# Patient Record
Sex: Male | Born: 1995 | Race: White | Hispanic: No | Marital: Single | Smoking: Never smoker
Health system: Southern US, Community
[De-identification: ages and names within clinical notes are randomized; demographics above are authoritative.]

## PROBLEM LIST (undated history)

## (undated) ENCOUNTER — Ambulatory Visit: Admission: EM | Payer: PRIVATE HEALTH INSURANCE

## (undated) DIAGNOSIS — F419 Anxiety disorder, unspecified: Secondary | ICD-10-CM

## (undated) DIAGNOSIS — M1990 Unspecified osteoarthritis, unspecified site: Secondary | ICD-10-CM

## (undated) DIAGNOSIS — J45909 Unspecified asthma, uncomplicated: Secondary | ICD-10-CM

## (undated) DIAGNOSIS — F32A Depression, unspecified: Secondary | ICD-10-CM

## (undated) HISTORY — DX: Unspecified asthma, uncomplicated: J45.909

## (undated) HISTORY — DX: Anxiety disorder, unspecified: F41.9

## (undated) HISTORY — DX: Depression, unspecified: F32.A

---

## 2016-01-07 ENCOUNTER — Encounter: Payer: Self-pay | Admitting: *Deleted

## 2016-01-07 ENCOUNTER — Emergency Department

## 2016-01-07 ENCOUNTER — Emergency Department
Admission: EM | Admit: 2016-01-07 | Discharge: 2016-01-07 | Disposition: A | Discharge status: Home / Self Care | Attending: Emergency Medicine | Admitting: Emergency Medicine

## 2016-01-07 DIAGNOSIS — S0990XA Unspecified injury of head, initial encounter: Secondary | ICD-10-CM | POA: Diagnosis present

## 2016-01-07 DIAGNOSIS — S0083XA Contusion of other part of head, initial encounter: Secondary | ICD-10-CM | POA: Diagnosis not present

## 2016-01-07 DIAGNOSIS — S0081XA Abrasion of other part of head, initial encounter: Secondary | ICD-10-CM | POA: Diagnosis not present

## 2016-01-07 DIAGNOSIS — F1310 Sedative, hypnotic or anxiolytic abuse, uncomplicated: Secondary | ICD-10-CM | POA: Insufficient documentation

## 2016-01-07 DIAGNOSIS — Y9389 Activity, other specified: Secondary | ICD-10-CM | POA: Diagnosis not present

## 2016-01-07 DIAGNOSIS — F10129 Alcohol abuse with intoxication, unspecified: Secondary | ICD-10-CM | POA: Diagnosis not present

## 2016-01-07 DIAGNOSIS — Y998 Other external cause status: Secondary | ICD-10-CM | POA: Insufficient documentation

## 2016-01-07 DIAGNOSIS — F17200 Nicotine dependence, unspecified, uncomplicated: Secondary | ICD-10-CM | POA: Diagnosis not present

## 2016-01-07 DIAGNOSIS — W01198A Fall on same level from slipping, tripping and stumbling with subsequent striking against other object, initial encounter: Secondary | ICD-10-CM | POA: Insufficient documentation

## 2016-01-07 DIAGNOSIS — Y9289 Other specified places as the place of occurrence of the external cause: Secondary | ICD-10-CM | POA: Insufficient documentation

## 2016-01-07 LAB — URINE DRUG SCREEN, QUALITATIVE (ARMC ONLY)
AMPHETAMINES, UR SCREEN: NOT DETECTED
BARBITURATES, UR SCREEN: NOT DETECTED
Benzodiazepine, Ur Scrn: POSITIVE — AB
COCAINE METABOLITE, UR ~~LOC~~: NOT DETECTED
Cannabinoid 50 Ng, Ur ~~LOC~~: NOT DETECTED
MDMA (Ecstasy)Ur Screen: NOT DETECTED
METHADONE SCREEN, URINE: NOT DETECTED
Opiate, Ur Screen: NOT DETECTED
Phencyclidine (PCP) Ur S: NOT DETECTED
TRICYCLIC, UR SCREEN: NOT DETECTED

## 2016-01-07 LAB — ETHANOL: Alcohol, Ethyl (B): 125 mg/dL — ABNORMAL HIGH (ref <5)
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
## 2016-01-07 NOTE — ED Notes (Signed)
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
## 2016-01-07 NOTE — Discharge Instructions (Signed)
Head Injury, Adult °You have a head injury. Headaches and throwing up (vomiting) are common after a head injury. It should be easy to wake up from sleeping. Sometimes you must stay in the hospital. Most problems happen within the first 24 hours. Side effects may occur up to 7-10 days after the injury.  °WHAT ARE THE TYPES OF HEAD INJURIES? °Head injuries can be as minor as a bump. Some head injuries can be more severe. More severe head injuries include: °· A jarring injury to the brain (concussion). °· A bruise of the brain (contusion). This mean there is bleeding in the brain that can cause swelling. °· A cracked skull (skull fracture). °· Bleeding in the brain that collects, clots, and forms a bump (hematoma). °WHEN SHOULD I GET HELP RIGHT AWAY?  °· You are confused or sleepy. °· You cannot be woken up. °· You feel sick to your stomach (nauseous) or keep throwing up (vomiting). °· Your dizziness or unsteadiness is getting worse. °· You have very bad, lasting headaches that are not helped by medicine. Take medicines only as told by your doctor. °· You cannot use your arms or legs like normal. °· You cannot walk. °· You notice changes in the black spots in the center of the colored part of your eye (pupil). °· You have clear or bloody fluid coming from your nose or ears. °· You have trouble seeing. °During the next 24 hours after the injury, you must stay with someone who can watch you. This person should get help right away (call 911 in the [place] if you start to shake and are not able to control it (have seizures), you pass out, or you are unable to wake up. °HOW CAN I PREVENT A HEAD INJURY IN THE FUTURE? °· Wear seat belts. °· Wear a helmet while bike riding and playing sports like football. °· Stay away from dangerous activities around the house. °WHEN CAN I RETURN TO NORMAL ACTIVITIES AND ATHLETICS? °See your doctor before doing these activities. You should not do normal activities or play contact sports until 1  week after the following symptoms have stopped: °· Headache that does not go away. °· Dizziness. °· Poor attention. °· Confusion. °· Memory problems. °· Sickness to your stomach or throwing up. °· Tiredness. °· Fussiness. °· Bothered by bright lights or loud noises. °· Anxiousness or depression. °· Restless sleep. °MAKE SURE YOU:  °· Understand these instructions. °· Will watch your condition. °· Will get help right away if you are not doing well or get worse. °  °This information is not intended to replace advice given to you by your health care provider. Make sure you discuss any questions you have with your health care provider. °  °Document Released: 09/16/2008 Document Revised: 10/25/2014 Document Reviewed: 06/11/2013 °Elsevier Interactive Patient Education ©2016 [employer] ° °

## 2016-06-22 ENCOUNTER — Encounter: Payer: Self-pay | Admitting: Emergency Medicine

## 2016-06-22 ENCOUNTER — Ambulatory Visit
Admission: EM | Admit: 2016-06-22 | Discharge: 2016-06-22 | Disposition: A | Discharge status: Home / Self Care | Attending: Family Medicine | Admitting: Family Medicine

## 2016-06-22 DIAGNOSIS — S81851A Open bite, right lower leg, initial encounter: Secondary | ICD-10-CM

## 2016-06-22 DIAGNOSIS — L03115 Cellulitis of right lower limb: Secondary | ICD-10-CM

## 2016-06-22 DIAGNOSIS — Z23 Encounter for immunization: Secondary | ICD-10-CM | POA: Diagnosis not present

## 2016-06-22 DIAGNOSIS — W540XXA Bitten by dog, initial encounter: Secondary | ICD-10-CM

## 2016-06-22 MED ORDER — CEFTRIAXONE SODIUM 1 G IJ SOLR
1.0000 g | Freq: Once | INTRAMUSCULAR | Status: AC
  Administered 2016-06-22: 1 g via INTRAMUSCULAR

## 2016-06-22 MED ORDER — AMOXICILLIN-POT CLAVULANATE 875-125 MG PO TABS: 1.0000 | ORAL_TABLET | Freq: Two times a day (BID) | ORAL | 0 refills | Status: DC

## 2016-06-22 MED ORDER — HYDROCODONE-ACETAMINOPHEN 5-325 MG PO TABS: ORAL_TABLET | ORAL | 0 refills | Status: DC

## 2016-06-22 MED ORDER — TETANUS-DIPHTH-ACELL PERTUSSIS 5-2.5-18.5 LF-MCG/0.5 IM SUSP
0.5000 mL | Freq: Once | INTRAMUSCULAR | Status: AC
  Administered 2016-06-22: 0.5 mL via INTRAMUSCULAR

## 2016-06-22 NOTE — ED Notes (Signed)
Filed report with Officer Riley KillSwartz with [facility].

## 2016-06-22 NOTE — ED Notes (Signed)
[facility] was notified of dog bite and was told they will have an on-call [facility] officer contact us back.

## 2016-06-22 NOTE — ED Triage Notes (Signed)
Patient states that he was breaking up a dog fight and got bit in his right lower leg yesterday.  Patient states that one of the dog is his and the other is his sister's.  Patient has one puncture wound to his right lower leg.  Patient has no bleeding at this time.

## 2016-06-22 NOTE — ED Notes (Signed)
Patient shows no signs of adverse reaction to medication at this time.  

## 2016-08-03 NOTE — ED Provider Notes (Addendum)
[facility]    CSN: [id number] Arrival date & time: 06/22/16  1831     History   Chief Complaint Chief Complaint  Patient presents with  . Puncture Wound  . Animal Bite    HPI Denis Carreon is a 20 y.o. male.   20 yo male with a c/o right leg pain and redness since dog bite last night. States that he was breaking up a dog fight and got bit in his right lower leg yesterday.  Patient states that one of the dog is his and the other is his sister's.  Patient has one puncture wound to his right lower leg.  Patient has no bleeding at this time. Dogs are up to date on vaccines including rabies.         The history is provided by the patient.  Animal Bite    History reviewed. No pertinent past medical history.  There are no active problems to display for this patient.   History reviewed. No pertinent surgical history.     Home Medications    Prior to Admission medications   Medication Sig Start Date End Date Taking? Authorizing Provider  amoxicillin-clavulanate (AUGMENTIN) 875-125 MG tablet Take 1 tablet by mouth 2 (two) times daily. 06/22/16   Payton Mccallum, MD  HYDROcodone-acetaminophen (NORCO/VICODIN) 5-325 MG tablet 1-2 tabs po q 8 hours prn 06/22/16   Payton Mccallum, MD    Family History History reviewed. No pertinent family history.  Social History Social History  Substance Use Topics  . Smoking status: Former Games developer  . Smokeless tobacco: Never Used  . Alcohol use Yes     Comment: 3 times a week     Allergies   Review of patient's allergies indicates no known allergies.   Review of Systems Review of Systems   Physical Exam Triage Vital Signs ED Triage Vitals  Enc Vitals Group     BP 06/22/16 1848 133/79     Pulse Rate 06/22/16 1848 75     Resp 06/22/16 1848 16     Temp 06/22/16 1848 99.6 F (37.6 C)     Temp Source 06/22/16 1848 Tympanic     SpO2 06/22/16 1848 100 %     Weight 06/22/16 1850 250 lb (113.4 kg)     Height 06/22/16 1850  6\' 2"  (1.88 m)     Head Circumference --      Peak Flow --      Pain Score 06/22/16 1851 9     Pain Loc --      Pain Edu? --      Excl. in GC? --    No data found.   Updated Vital Signs BP 133/79 (BP Location: Right Arm)   Pulse 75   Temp 99.6 F (37.6 C) (Tympanic)   Resp 16   Ht 6\' 2"  (1.88 m)   Wt 250 lb (113.4 kg)   SpO2 100%   BMI 32.10 kg/m   Visual Acuity Right Eye Distance:   Left Eye Distance:   Bilateral Distance:    Right Eye Near:   Left Eye Near:    Bilateral Near:     Physical Exam  Constitutional: He appears well-developed and well-nourished. No distress.  Cardiovascular: Intact distal pulses.   Musculoskeletal:  Right lower extremity neurovascularly intact; puncture wound to right lower leg, shin area with surrounding blanchable erythema, edema, warmth and tenderness to palpation  Skin: He is not diaphoretic.  Nursing note and vitals reviewed.    UC  Treatments / Results  Labs (all labs ordered are listed, but only abnormal results are displayed) Labs Reviewed - No data to display  EKG  EKG Interpretation None       Radiology No results found.  Procedures Procedures (including critical care time)  Medications Ordered in UC Medications  Tdap (BOOSTRIX) injection 0.5 mL (0.5 mLs Intramuscular Given 06/22/16 1855)  cefTRIAXone (ROCEPHIN) injection 1 g (1 g Intramuscular Given 06/22/16 1951)     Initial Impression / Assessment and Plan / UC Course  I have reviewed the triage vital signs and the nursing notes.  Pertinent labs & imaging results that were available during my care of the patient were reviewed by me and considered in my medical decision making (see chart for details).  Clinical Course      Final Clinical Impressions(s) / UC Diagnoses   Final diagnoses:  Dog bite of right lower leg, initial encounter  Cellulitis of right lower extremity    New Prescriptions Discharge Medication List as of 06/22/2016  8:06 PM      START taking these medications   Details  amoxicillin-clavulanate (AUGMENTIN) 875-125 MG tablet Take 1 tablet by mouth 2 (two) times daily., Starting Tue 06/22/2016, Normal    HYDROcodone-acetaminophen (NORCO/VICODIN) 5-325 MG tablet 1-2 tabs po q 8 hours prn, Print       1. diagnosis reviewed with patient 2. rx as per orders above; reviewed possible side effects, interactions, risks and benefits  3. Patient given rocephin 1gm im x 1 4. Recommend supportive treatment with warm compresses to area 5.Follow-up prn if symptoms worsen or don't improve   Payton Mccallumrlando Hideko Esselman, MD 08/03/16 1750    Payton Mccallumrlando Nahuel Wilbert, MD 08/03/16 1752

## 2017-01-23 ENCOUNTER — Ambulatory Visit
Admission: EM | Admit: 2017-01-23 | Discharge: 2017-01-23 | Disposition: A | Discharge status: Home / Self Care | Payer: Managed Care, Other (non HMO) | Attending: Emergency Medicine | Admitting: Emergency Medicine

## 2017-01-23 ENCOUNTER — Encounter: Payer: Self-pay | Admitting: Gynecology

## 2017-01-23 ENCOUNTER — Ambulatory Visit (INDEPENDENT_AMBULATORY_CARE_PROVIDER_SITE_OTHER): Payer: Managed Care, Other (non HMO)

## 2017-01-23 DIAGNOSIS — M5481 Occipital neuralgia: Secondary | ICD-10-CM | POA: Diagnosis not present

## 2017-01-23 MED ORDER — KETOROLAC TROMETHAMINE 60 MG/2ML IM SOLN
60.0000 mg | Freq: Once | INTRAMUSCULAR | Status: AC
  Administered 2017-01-23: 60 mg via INTRAMUSCULAR

## 2017-01-23 MED ORDER — GABAPENTIN 300 MG PO CAPS: ORAL_CAPSULE | ORAL | 0 refills | Status: DC

## 2017-01-23 MED ORDER — TRAZODONE HCL 50 MG PO TABS: 50.0000 mg | ORAL_TABLET | Freq: Every day | ORAL | 0 refills | Status: DC

## 2017-01-23 NOTE — ED Provider Notes (Signed)
CSN: [id number]     Arrival date & time 01/23/17  1308 History   First MD Initiated Contact with Patient 01/23/17 [phone]     Chief Complaint  Patient presents with  . Neck Injury   (Consider location/radiation/quality/duration/timing/severity/associated sxs/prior Treatment) HPI  This a very pleasant 21 year old male who presents with left-sided neck pain that radiates into his occipital region x 2 months. He has had no injury that he knows of. He does lift heavy weights for recreation. He states that he gets very minimal amount of sleep at night due to the constant pain that he has when he lies down. The pain is most intense in a small area on the left occipital  posterior to his ear. He states in the morning  when he awakes his left eye feels "strained"   in comparison to his right. He has been seen by a physician in the past and has been given tramadol which has not been helpful. He has been using over-the-counter medications of Aleve and ibuprofen but also has not been helping. Pain is worse with rotation ,extension and flexion of his neck. He denies any upper extremity radicular symptoms. Has not lost any of the muscle strength of his upper extremities.       History reviewed. No pertinent past medical history. History reviewed. No pertinent surgical history. No family history on file. Social History  Substance Use Topics  . Smoking status: Former Games developer  . Smokeless tobacco: Never Used  . Alcohol use Yes     Comment: 3 times a week    Review of Systems  Constitutional: Positive for activity change. Negative for chills, diaphoresis and fatigue.  Musculoskeletal: Positive for neck pain and neck stiffness.  All other systems reviewed and are negative.   Allergies  Vancomycin  Home Medications   Prior to Admission medications   Medication Sig Start Date End Date Taking? Authorizing Provider  amoxicillin-clavulanate (AUGMENTIN) 875-125 MG tablet Take 1 tablet by mouth 2 (two)  times daily. 06/22/16   Payton Mccallum, MD  gabapentin (NEURONTIN) 300 MG capsule 1 tab 300 mg on day 1; 1 tabs BID day 2; 1 tab 3 times a day on day 3 and thereafter 01/23/17   Lutricia Feil, PA-C  HYDROcodone-acetaminophen (NORCO/VICODIN) 5-325 MG tablet 1-2 tabs po q 8 hours prn 06/22/16   Payton Mccallum, MD  traZODone (DESYREL) 50 MG tablet Take 1 tablet (50 mg total) by mouth at bedtime. 01/23/17   Lutricia Feil, PA-C   Meds Ordered and Administered this Visit   Medications  ketorolac (TORADOL) injection 60 mg (60 mg Intramuscular Given 01/23/17 1003)    BP 140/66 (BP Location: Left Arm)   Pulse 85   Temp 98.5 F (36.9 C) (Oral)   Resp 18   Ht  (1.854 m)   Wt 264 lb (119.7 kg)   SpO2 98%   BMI 34.83 kg/m  No data found.   Physical Exam  Constitutional: He is oriented to person, place, and time. He appears well-developed and well-nourished. No distress.  HENT:  Head: Normocephalic and atraumatic.  Eyes: EOM are normal. Pupils are equal, round, and reactive to light. Right eye exhibits no discharge. Left eye exhibits no discharge.  Neck: Neck supple.  Examination of the cervical spine shows restricted range of motion to right rotation extension and flexion. She'll restart sensation is intact to light touch throughout. Strength is intact to clinical testing. Percussion and pressure over the occipital area in the ridge  does not show any tenderness there is a negative Tinel's today.  Musculoskeletal: Normal range of motion.  Lymphadenopathy:    He has no cervical adenopathy.  Neurological: He is alert and oriented to person, place, and time. He displays normal reflexes. No cranial nerve deficit or sensory deficit. He exhibits normal muscle tone. Coordination normal.  Skin: Skin is warm and dry. He is not diaphoretic.  Psychiatric: He has a normal mood and affect. His behavior is normal. Judgment and thought content normal.  Nursing note and vitals reviewed.   Urgent Care Course      Procedures (including critical care time)  Labs Review Labs Reviewed - No data to display  Imaging Review Dg Cervical Spine Complete  Result Date: 01/23/2017 CLINICAL DATA:  Left-sided neck pain at base of skull for 2 months. No known injury. EXAM: CERVICAL SPINE - COMPLETE 4+ VIEW COMPARISON:  None. FINDINGS: There is no evidence of cervical spine fracture or prevertebral soft tissue swelling. Alignment is normal. No other significant bone abnormalities are identified. IMPRESSION: Negative cervical spine radiographs. Electronically Signed   By: Bary Richard M.D.   On: 01/23/2017 10:07     Visual Acuity Review  Right Eye Distance:   Left Eye Distance:   Bilateral Distance:    Right Eye Near:   Left Eye Near:    Bilateral Near:         MDM   1. Cervico-occipital neuralgia of left side    Discharge Medication List as of 01/23/2017 10:27 AM    START taking these medications   Details  gabapentin (NEURONTIN) 300 MG capsule 1 tab 300 mg on day 1; 1 tabs BID day 2; 1 tab 3 times a day on day 3 and thereafter, Normal    traZODone (DESYREL) 50 MG tablet Take 1 tablet (50 mg total) by mouth at bedtime., Starting Sun 01/23/2017, Normal      Plan: 1. Test/x-ray results and diagnosis reviewed with patient 2. rx as per orders; risks, benefits, potential side effects reviewed with patient 3. Recommend supportive treatment with warm compresses to the occipital area 3 times daily may provide some relief. I recommended he follow-up with Dr. Sherryll Burger, neurologist further evaluation and treatment of the neuralgia that he has. Review these x-rays with the patient no abnormality seen. He may benefit from a injection of the occipital nerve in the left. He states he'll make an appointment with Dr. Sherryll Burger on Monday. 4. F/u prn if symptoms worsen or don't improve     Lutricia Feil, PA-C 01/23/17 [address] [place], [place] 01/23/17 1152

## 2017-01-23 NOTE — ED Triage Notes (Signed)
Patient c/o left side neck pain x over 2 months. Per patient pain travel up to head and gives him an headache.

## 2017-05-03 ENCOUNTER — Encounter: Payer: Self-pay | Admitting: *Deleted

## 2017-05-03 ENCOUNTER — Ambulatory Visit
Admission: EM | Admit: 2017-05-03 | Discharge: 2017-05-03 | Disposition: A | Discharge status: Home / Self Care | Payer: PRIVATE HEALTH INSURANCE | Attending: Family Medicine | Admitting: Family Medicine

## 2017-05-03 DIAGNOSIS — M5441 Lumbago with sciatica, right side: Secondary | ICD-10-CM | POA: Diagnosis not present

## 2017-05-03 MED ORDER — MELOXICAM 15 MG PO TABS: 15.0000 mg | ORAL_TABLET | Freq: Every day | ORAL | 0 refills | Status: DC | PRN

## 2017-05-03 MED ORDER — CYCLOBENZAPRINE HCL 10 MG PO TABS: 10.0000 mg | ORAL_TABLET | Freq: Two times a day (BID) | ORAL | 0 refills | Status: DC | PRN

## 2017-05-03 MED ORDER — OXYCODONE-ACETAMINOPHEN 5-325 MG PO TABS: 1.0000 | ORAL_TABLET | Freq: Three times a day (TID) | ORAL | 0 refills | Status: DC | PRN

## 2017-05-03 NOTE — Discharge Instructions (Signed)
Take medication as prescribed. Rest. Drink plenty of fluids. Avoid strenuous activity or lifting. Do not take muscle relaxant or pain medication while on duty.  Follow up with your primary care physician or the above this week as needed. Return to Urgent care for new or worsening concerns.

## 2017-05-03 NOTE — ED Provider Notes (Signed)
[facility] ____________________________________________  Time seen: Approximately 9:03 AM  I have reviewed the triage vital signs and the nursing notes.   HISTORY  Chief Complaint Back Pain   HPI Adam Clayton is a 21 y.o. male  presents for evaluation of low back pain with intermittent radiation down right leg. Patient reports he frequently lifts weights. Reports this past 03/03/2023 he was doing dead lifts. Patient reports no abrupt onset of back pain. States Friday he had some soreness to his low back. Reports Saturday when he woke up he had pain to his right lower back going into her right leg and intermittent tingling sensation to right foot. Denies any complete paresthesias. States right foot tingling sensation comes and goes. Denies any other sensation changes to right leg. States her right low back pain and right upper leg pain is constant. Reports pain does improved and comfortable positions but never fully resolves. Reports unresolved with over-the-counter ibuprofen. Reports continues to remain ambulatory. Denies any fall, direct injury or direct trauma. Denies chronic back pain. States pain does increase with movement some. Denies urinary or bowel retention or incontinence. Denies fevers. Denies abdominal pain, chest pain, shortness of breath, penile or testicular pain, recent sickness or other complaints. States mild to moderate pain at this time. Denies other alleviating measures. Denies other aggravating or alleviating factors. Reports healthy person. Denies chronic medical problems.  Denies recent sickness. Denies recent antibiotic use.  History reviewed. No pertinent past medical history.  denies  There are no active problems to display for this patient.   History reviewed. No pertinent surgical history.   No current facility-administered medications for this encounter.   Current Outpatient Prescriptions:  .  amoxicillin-clavulanate (AUGMENTIN) 875-125 MG  tablet, Take 1 tablet by mouth 2 (two) times daily., Disp: 20 tablet, Rfl: 0 .  cyclobenzaprine (FLEXERIL) 10 MG tablet, Take 1 tablet (10 mg total) by mouth 2 (two) times daily as needed for muscle spasms. Do not drive while taking as can cause drowsiness, Disp: 15 tablet, Rfl: 0 .  gabapentin (NEURONTIN) 300 MG capsule, 1 tab 300 mg on day 1; 1 tabs BID day 2; 1 tab 3 times a day on day 3 and thereafter, Disp: 90 capsule, Rfl: 0 .  HYDROcodone-acetaminophen (NORCO/VICODIN) 5-325 MG tablet, 1-2 tabs po q 8 hours prn, Disp: 10 tablet, Rfl: 0 .  meloxicam (MOBIC) 15 MG tablet, Take 1 tablet (15 mg total) by mouth daily as needed., Disp: 10 tablet, Rfl: 0 .  oxyCODONE-acetaminophen (ROXICET) 5-325 MG tablet, Take 1 tablet by mouth every 8 (eight) hours as needed for moderate pain or severe pain (Do not drive or operate heavy machinery while taking as can cause drowsiness.)., Disp: 6 tablet, Rfl: 0 .  traZODone (DESYREL) 50 MG tablet, Take 1 tablet (50 mg total) by mouth at bedtime., Disp: 15 tablet, Rfl: 0  Allergies Vancomycin  History reviewed. No pertinent family history.  Social History Social History  Substance Use Topics  . Smoking status: Former Games developer  . Smokeless tobacco: Never Used  . Alcohol use Yes     Comment: 3 times a week    Review of Systems Constitutional: No fever/chills Cardiovascular: Denies chest pain. Respiratory: Denies shortness of breath. Gastrointestinal: No abdominal pain.  No nausea, no vomiting.  No diarrhea.  No constipation. Genitourinary: Negative for dysuria. Musculoskeletal: Positive for back pain. Skin: Negative for rash.  ____________________________________________   PHYSICAL EXAM:  VITAL SIGNS: ED Triage Vitals  Enc Vitals Group  BP 05/03/17 0823 (!) 158/86     Pulse Rate 05/03/17 0823 72     Resp 05/03/17 0823 16     Temp 05/03/17 0823 98.3 F (36.8 C)     Temp Source 05/03/17 0823 Oral     SpO2 05/03/17 0823 97 %     Weight  05/03/17 0825 265 lb (120.2 kg)     Height 05/03/17 0825 6\' 1"  (1.854 m)     Head Circumference --      Peak Flow --      Pain Score 05/03/17 0826 7     Pain Loc --      Pain Edu? --      Excl. in GC? --     Constitutional: Alert and oriented. Well appearing and in no acute distress. Eyes: Conjunctivae are normal. ENT      Head: Normocephalic and atraumatic. Cardiovascular: Normal rate, regular rhythm. Grossly normal heart sounds.  Good peripheral circulation. Respiratory: Normal respiratory effort without tachypnea nor retractions. Breath sounds are clear and equal bilaterally. No wheezes, rales, rhonchi. Gastrointestinal: Soft and nontender. No CVA tenderness. Musculoskeletal:  Nontender with normal range of motion in all extremities. No midline cervical, thoracic or lumbar tenderness to palpation. Bilateral pedal pulses equal and easily palpated.      Right lower leg:  No tenderness or edema.      Left lower leg:  No tenderness or edema.  Right lower para lumbar mild tenderness over the greater sciatic notch area mild midline tenderness, no hip point bony tenderness, right upper extremity nontender to palpation, no saddle anesthesia, no pain change with standing bilateral knee lifts, full range of motion present, mild pain increases with lumbar flexion and extension as well as right and left leg rotation, bilateral plantar flexion and dorsiflexion strong and equal, 2+ patellar and Achilles reflexes bilaterally, distal sensation to bilateral toes equal with normal capillary refill. Ambulatory in room with steady gait. Changes positions quickly. Neurologic:  Normal speech and language. No gross focal neurologic deficits are appreciated. Speech is normal. No gait instability.  Skin:  Skin is warm, dry and intact. No rash noted. Psychiatric: Mood and affect are normal. Speech and behavior are normal. Patient exhibits appropriate insight and judgment     ___________________________________________   LABS (all labs ordered are listed, but only abnormal results are displayed)  Labs Reviewed - No data to display  PROCEDURES Procedures   INITIAL IMPRESSION / ASSESSMENT AND PLAN / ED COURSE  Pertinent labs & imaging results that were available during my care of the patient were reviewed by me and considered in my medical decision making (see chart for details).  Well-appearing patient. No acute distress. No midline tenderness. No direct trauma. No focal neurological deficits. Discussed in detail with patient suspect right low back pain with sciatica. Counseled regarding concern for potential disc issues. Encouraged avoidance of heavy lifting. Discussed with patient we'll defer x-ray at this time is no midline tenderness and no trauma, patient agrees. Encouraged supportive care, rest, ice, stretching area will treat patient with oral daily Mobic, when necessary Flexeril and quantity 6 Percocet given as needed for breakthrough pain. 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 Follow-up with primary care physician or orthopedic as needed for continued pain. Discussed strict follow-up and return parameters.  Discussed follow up with Primary care physician this week. Discussed follow up and return parameters including no resolution  or any worsening concerns. Patient verbalized understanding and agreed to plan.   ____________________________________________   FINAL CLINICAL IMPRESSION(S) / ED DIAGNOSES  Final diagnoses:  Acute right-sided low back pain with right-sided sciatica     New Prescriptions   CYCLOBENZAPRINE (FLEXERIL) 10 MG TABLET    Take 1 tablet (10 mg total) by mouth 2 (two) times daily as needed for muscle spasms. Do not drive while taking as can cause drowsiness   MELOXICAM (MOBIC) 15 MG TABLET    Take 1 tablet (15 mg total) by mouth daily as  needed.   OXYCODONE-ACETAMINOPHEN (ROXICET) 5-325 MG TABLET    Take 1 tablet by mouth every 8 (eight) hours as needed for moderate pain or severe pain (Do not drive or operate heavy machinery while taking as can cause drowsiness.).    Note: This dictation was prepared with Dragon dictation along with smaller phrase technology. Any transcriptional errors that result from this process are unintentional.         Renford DillsMiller, Ihor Meinzer, NP 05/03/17 [phone]

## 2017-05-03 NOTE — ED Triage Notes (Signed)
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 Gradual onset low back pain over past 2-3 days. Pain is now low back and radiating down right leg with numbness/tingling to right foot.

## 2017-05-12 DIAGNOSIS — M5441 Lumbago with sciatica, right side: Secondary | ICD-10-CM

## 2017-05-12 DIAGNOSIS — M542 Cervicalgia: Secondary | ICD-10-CM | POA: Insufficient documentation

## 2017-05-12 HISTORY — DX: Lumbago with sciatica, right side: M54.41

## 2017-09-06 ENCOUNTER — Emergency Department: Payer: Worker's Compensation

## 2017-09-06 ENCOUNTER — Other Ambulatory Visit: Payer: Self-pay

## 2017-09-06 ENCOUNTER — Encounter: Payer: Self-pay | Admitting: Emergency Medicine

## 2017-09-06 ENCOUNTER — Emergency Department
Admission: EM | Admit: 2017-09-06 | Discharge: 2017-09-06 | Disposition: A | Discharge status: Home / Self Care | Payer: Worker's Compensation | Attending: Emergency Medicine | Admitting: Emergency Medicine

## 2017-09-06 DIAGNOSIS — Y998 Other external cause status: Secondary | ICD-10-CM | POA: Diagnosis not present

## 2017-09-06 DIAGNOSIS — S161XXA Strain of muscle, fascia and tendon at neck level, initial encounter: Secondary | ICD-10-CM | POA: Insufficient documentation

## 2017-09-06 DIAGNOSIS — Y939 Activity, unspecified: Secondary | ICD-10-CM | POA: Diagnosis not present

## 2017-09-06 DIAGNOSIS — G44319 Acute post-traumatic headache, not intractable: Secondary | ICD-10-CM | POA: Insufficient documentation

## 2017-09-06 DIAGNOSIS — Z79899 Other long term (current) drug therapy: Secondary | ICD-10-CM | POA: Diagnosis not present

## 2017-09-06 DIAGNOSIS — Y92410 Unspecified street and highway as the place of occurrence of the external cause: Secondary | ICD-10-CM | POA: Insufficient documentation

## 2017-09-06 DIAGNOSIS — S199XXA Unspecified injury of neck, initial encounter: Secondary | ICD-10-CM | POA: Diagnosis present

## 2017-09-06 MED ORDER — MELOXICAM 15 MG PO TABS: 15.0000 mg | ORAL_TABLET | Freq: Every day | ORAL | 0 refills | Status: DC

## 2017-09-06 MED ORDER — CYCLOBENZAPRINE HCL 10 MG PO TABS: 10.0000 mg | ORAL_TABLET | Freq: Three times a day (TID) | ORAL | 0 refills | Status: DC | PRN

## 2017-09-06 NOTE — ED Notes (Signed)
Pt completed w/c urine drug screen per profile sheet and employer; chain of custody was completed and specimen was hand delivered to the lab for carrier pick up; pt given his copy of COC and employers copy

## 2017-09-06 NOTE — ED Notes (Signed)
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
## 2017-09-06 NOTE — Discharge Instructions (Signed)
Follow up with the primary care provider of your choice for symptoms that are not improving over the next week or so.  Return to the ER for symptoms that change or worsen if unable to schedule an appointment.
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
## 2018-09-25 DIAGNOSIS — E669 Obesity, unspecified: Secondary | ICD-10-CM | POA: Insufficient documentation

## 2018-09-25 DIAGNOSIS — Z0000 Encounter for general adult medical examination without abnormal findings: Secondary | ICD-10-CM | POA: Insufficient documentation

## 2018-10-15 ENCOUNTER — Encounter: Payer: Self-pay | Admitting: *Deleted

## 2018-10-15 ENCOUNTER — Other Ambulatory Visit: Payer: Self-pay

## 2018-10-15 ENCOUNTER — Emergency Department
Admission: EM | Admit: 2018-10-15 | Discharge: 2018-10-15 | Disposition: A | Discharge status: Home / Self Care | Payer: Worker's Compensation | Attending: Emergency Medicine | Admitting: Emergency Medicine

## 2018-10-15 ENCOUNTER — Emergency Department: Payer: Worker's Compensation

## 2018-10-15 DIAGNOSIS — Y9389 Activity, other specified: Secondary | ICD-10-CM | POA: Insufficient documentation

## 2018-10-15 DIAGNOSIS — X509XXA Other and unspecified overexertion or strenuous movements or postures, initial encounter: Secondary | ICD-10-CM | POA: Diagnosis not present

## 2018-10-15 DIAGNOSIS — Y990 Civilian activity done for income or pay: Secondary | ICD-10-CM | POA: Insufficient documentation

## 2018-10-15 DIAGNOSIS — Y9289 Other specified places as the place of occurrence of the external cause: Secondary | ICD-10-CM | POA: Insufficient documentation

## 2018-10-15 DIAGNOSIS — S6992XA Unspecified injury of left wrist, hand and finger(s), initial encounter: Secondary | ICD-10-CM | POA: Diagnosis present

## 2018-10-15 HISTORY — DX: Unspecified osteoarthritis, unspecified site: M19.90
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
## 2018-10-15 NOTE — Discharge Instructions (Addendum)
Take ibuprofen today for pain.  Ice thumb on and off today.
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
## 2018-10-15 NOTE — ED Triage Notes (Signed)
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 Pt states heard a pop when he hyperextended thumb.

## 2019-02-06 DIAGNOSIS — F411 Generalized anxiety disorder: Secondary | ICD-10-CM | POA: Insufficient documentation

## 2019-02-07 DIAGNOSIS — Z8659 Personal history of other mental and behavioral disorders: Secondary | ICD-10-CM | POA: Insufficient documentation

## 2019-02-07 DIAGNOSIS — F3341 Major depressive disorder, recurrent, in partial remission: Secondary | ICD-10-CM | POA: Insufficient documentation
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
## 2019-12-11 ENCOUNTER — Ambulatory Visit
Admission: EM | Admit: 2019-12-11 | Discharge: 2019-12-11 | Disposition: A | Discharge status: Home / Self Care | Payer: PRIVATE HEALTH INSURANCE | Attending: Urgent Care | Admitting: Urgent Care

## 2019-12-11 ENCOUNTER — Encounter: Payer: Self-pay | Admitting: Emergency Medicine

## 2019-12-11 ENCOUNTER — Other Ambulatory Visit: Payer: Self-pay

## 2019-12-11 DIAGNOSIS — R319 Hematuria, unspecified: Secondary | ICD-10-CM | POA: Diagnosis present

## 2019-12-11 LAB — URINALYSIS, COMPLETE (UACMP) WITH MICROSCOPIC
Bilirubin Urine: NEGATIVE
Glucose, UA: NEGATIVE mg/dL
Ketones, ur: NEGATIVE mg/dL
Leukocytes,Ua: NEGATIVE
Nitrite: NEGATIVE
Protein, ur: NEGATIVE mg/dL
Specific Gravity, Urine: 1.02 (ref 1.005–1.030)
pH: 7 (ref 5.0–8.0)

## 2019-12-11 LAB — CHLAMYDIA/NGC RT PCR (ARMC ONLY): N gonorrhoeae: NOT DETECTED

## 2019-12-11 LAB — CHLAMYDIA/NGC RT PCR (ARMC ONLY)??????????: Chlamydia Tr: NOT DETECTED

## 2019-12-11 NOTE — ED Triage Notes (Signed)
Patient c/o hematuria x 1 2 days ago and then x 1 again today. Patient denies urinary symptoms. Patient is not concerned for STDs.

## 2019-12-11 NOTE — Discharge Instructions (Addendum)
It was very nice seeing you today in clinic. Thank you for entrusting me with your care.   Increase fluid intake. Urology will see you in the morning.   If your symptoms/condition worsens, please seek follow up care either here or in the ER. Please remember, our [facility] providers are "right here with you" when you need [place].   Again, it was my pleasure to take care of you today. Thank you for choosing our clinic. I hope that you start to feel better quickly.   Quentin Mulling, MSN, APRN, FNP-C, CEN Advanced Practice Provider [facility] [place] [facility]

## 2019-12-11 NOTE — ED Provider Notes (Signed)
[place]   Name: Adam Clayton DOB: 08-23-1996 MRN: [id number] CSN: [id number] PCP: Patient, No Pcp Per  Arrival date and time:  12/11/19 1407  Chief Complaint:  Hematuria   NOTE: Prior to seeing the patient today, I have reviewed the triage nursing documentation and vital signs. Clinical staff has updated patient's PMH/PSHx, current medication list, and drug allergies/intolerances to ensure comprehensive history available to assist in medical decision making.   History:   HPI: Adam Clayton is a 24 y.o. male who presents today with complaints of painless hematuria x 2 episodes. Initial episode occurred on Sunday (12/09/19) following sexual intercourse. Patient advises that a significant amount of BRB was appreciated to the tip of his penis after his sexual activity. Patient initially thought the bleeding was from his partner or in his seminal fluid, however after initial post-coital void, he appreciated continued bleeding. Patient did not experience any pain during coitus. He was able to maintain an erection and ejaculate normally. On Sunday, patient increased increased his fluid intake and notes that he had no recurrent episodes until about an hour prior to arrival today. He took a picture of the blood for provider review (attached below to physical exam). Patient reports that her experienced another episode of BRB associated with micturition. Patient denies any nausea, vomiting, fever, or chills. He has not experienced any penile pain, testicular pain, scrotal swelling, or penile discharge. He denies a PMH significant for urinary tract infection, urolithiasis, or STIs. Patient endorses unprotected sexual intercourse with a single male partner in the context of a committed monogamous relationship. He denies concerns of STI. Patient is very anxious upon arrival.   Past Medical History:  Diagnosis Date  . Arthritis     History reviewed. No pertinent surgical history.  History reviewed.  No pertinent family history.  Social History   Tobacco Use  . Smoking status: Never Smoker  . Smokeless tobacco: Never Used  Substance Use Topics  . Alcohol use: Yes    Comment: 3 times a week  . Drug use: No    There are no problems to display for this patient.   Home Medications:    Current Meds  Medication Sig  . sertraline (ZOLOFT) 100 MG tablet Take by mouth.    Allergies:   Vancomycin  Review of Systems (ROS):  Review of systems NEGATIVE unless otherwise noted in narrative H&P section.   Vital Signs: Today's Vitals   12/11/19 1425 12/11/19 1427 12/11/19 1537  BP:  (!) 141/82   Pulse:  97   Resp:  18   Temp:  98.8 F (37.1 C)   TempSrc:  Oral   SpO2:  99%   Weight: 265 lb (120.2 kg)    Height: 6\' 1"  (1.854 m)    PainSc: 0-No pain  0-No pain    Physical Exam: Physical Exam  Constitutional: He is oriented to person, place, and time and well-developed, well-nourished, and in no distress.  HENT:  Head: Normocephalic and atraumatic.  Eyes: Pupils are equal, round, and reactive to light.  Cardiovascular: Normal rate, regular rhythm, normal heart sounds and intact distal pulses.  Pulmonary/Chest: Effort normal and breath sounds normal.  Abdominal: Soft. Bowel sounds are normal. He exhibits no distension. There is no abdominal tenderness. There is no CVA tenderness.  Genitourinary:    Genitourinary Comments: Exam deferred; no bleeding, pain, or external genitalia symptoms at time of visit.    Neurological: He is alert and oriented to person, place, and time. Gait normal.  Skin: Skin is warm and dry. No rash noted. He is not diaphoretic.  Psychiatric: Memory, affect and judgment normal. His mood appears anxious.  Nursing note and vitals reviewed.    Urgent Care Treatments / Results:   Orders Placed This Encounter  Procedures  . Urine culture  . Chlamydia/NGC rt PCR ([facility] only)  . Urinalysis, Complete w Microscopic    LABS: PLEASE NOTE: all labs  that were ordered this encounter are listed, however only abnormal results are displayed. Labs Reviewed  URINALYSIS, COMPLETE (UACMP) WITH MICROSCOPIC - Abnormal; Notable for the following components:      Result Value   Hgb urine dipstick TRACE (*)    Bacteria, UA FEW (*)    All other components within normal limits  URINE CULTURE  CHLAMYDIA/NGC RT PCR ([facility] ONLY)    EKG: -None  RADIOLOGY: No results found.  PROCEDURES: Procedures  MEDICATIONS RECEIVED THIS VISIT: Medications - No data to display  PERTINENT CLINICAL COURSE NOTES/UPDATES:   Initial Impression / Assessment and Plan / Urgent Care Course:  Pertinent labs & imaging results that were available during my care of the patient were personally reviewed by me and considered in my medical decision making (see lab/imaging section of note for values and interpretations).  Adam Clayton is a 24 y.o. male who presents to [facility] today with complaints of Hematuria  Patient is well appearing overall in clinic today. He does not appear to be in any acute distress. Presenting symptoms (see HPI) and exam as documented above. Patient with two episodes of painless hematuria, with the last occurring 1 hour PTA. Pain denies history of urinary tract infection, urolithiasis, or STI. Will check UA and proceed based on findings.    UA today negative for infection; (+) trace hgb, 0-5 WBC/hpf, 6-10 RBC/hpf, few bacteria, and no nitrites or LE  Findings discussed with patient. Based on the photograph of the amount of blood, I feel as if he needs further evaluation by urology for consideration of a CT urogram +/- cystoscopy. Reassurance provided that etiology of this is likely benign and referral to urology is precautionary keeping his best interest in mind. Communicated with Eula Flax and Richardo Hanks, MD via [facility] for recommendations. Urology providers asking for urine C&S and GC testing to be sent in preparation for consult  visit. Dr. Aleene Davidson has agreed to expedite seeing this patient and can see him tomorrow morning at 10:00 AM in the [place], [place] office. Details communicated to patient and he agreed with the plan of care as discussed. Patient was advised to increase water intake between now and the time of his clinic visit tomorrow. Again, reassurance provided. Discussed that development of pain, uncontrolled bleeding, dizziness, or fevers tonight will necessitate him being seen in the emergency department for more immediate evaluation. Name and office contact information provided on today's AVS for Dr. Richardo Hanks and patient was encouraged to attend scheduled appointment without fail.   At the time of discharge, he verbalized understanding and consent with the discharge plan as it was reviewed with him. All questions were fielded by provider and/or clinic staff prior to patient discharge.    Final Clinical Impressions / Urgent Care Diagnoses:   Final diagnoses:  Painless hematuria    New Prescriptions:  [place] Controlled Substance Registry consulted? Not Applicable  No orders of the defined types were placed in this encounter.   Recommended Follow up Care:  Patient encouraged to follow up with the following provider within the specified time frame,  or sooner as dictated by the severity of his symptoms. As always, he was instructed that for any urgent/emergent care needs, he should seek care either here or in the emergency department for more immediate evaluation.  Follow-up Information    Billey Co, MD On 12/12/2019.   Specialty: Urology Why: YOU HAVE AN APPOINTMENT AT 10:00AM Contact information: [place] [address] [phone]         NOTE: This note was prepared using Dragon dictation software along with smaller phrase technology. Despite my best ability to proofread, there is the potential that transcriptional errors may still occur from this process, and are completely  unintentional.    Karen Kitchens, NP 12/11/19 2154

## 2019-12-12 ENCOUNTER — Ambulatory Visit: Admitting: Urology

## 2019-12-12 ENCOUNTER — Encounter: Payer: Self-pay | Admitting: Urology

## 2019-12-12 VITALS — BP 129/73 | HR 75 | Ht 74.0 in | Wt 270.6 lb

## 2019-12-12 DIAGNOSIS — R310 Gross hematuria: Secondary | ICD-10-CM | POA: Diagnosis not present

## 2019-12-12 LAB — URINE CULTURE: Culture: NO GROWTH

## 2019-12-12 NOTE — Progress Notes (Signed)
12/12/19 11:32 AM   [name] December 11, 1995 [id number]  CC: Gross hematuria  HPI: I saw [name] in urology clinic today as an add-on for gross hematuria.  He is an otherwise healthy 24 year old male that noticed some blood at the meatus 3 days ago after intercourse with his girlfriend.  He denies any pain associated with this episode.  He had a recurrence of bright red gross hematuria while urinating yesterday that was again completely painless.  This resolved immediately and his urine has been clear yellow since then.  He denies any flank pain or history of nephrolithiasis.  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  He denies any family history of any urologic malignancies.  He is a non-smoker and denies any other carcinogenic exposures.  He denies any difficulty urinating, dysuria, or weak stream.  He denies any bone pain or weight loss.  Urinalysis yesterday in urgent care after his episode of hematuria showed 6-10 RBCs, few bacteria, 0-5 WBCs, no leukocytes, nitrite negative.  PMH: Past Medical History:  Diagnosis Date  . Arthritis     Surgical History: No past surgical history on file.  Family History: No family history on file.  Social History:  reports that he has never smoked. He has never used smokeless tobacco. He reports current alcohol use. He reports that he does not use drugs.  Physical Exam: BP 129/73 (BP Location: Left Arm, Patient Position: Sitting, Cuff Size: Large)   Pulse 75   Ht 6\' 2"  (1.88 m)   Wt 270 lb 9.6 oz (122.7 kg)   BMI 34.74 kg/m    Constitutional:  Alert and oriented, No acute distress. Cardiovascular: No clubbing, cyanosis, or edema. Respiratory: Normal respiratory effort, no increased work of breathing. GI: Abdomen is soft, nontender, nondistended, no abdominal masses GU: Circumcised phallus with widely patent meatus, testicles 20 cc and descended bilaterally without masses Lymph:  No cervical or inguinal lymphadenopathy. Skin: No rashes, bruises or suspicious lesions. Neurologic: Grossly intact, no focal deficits, moving all 4 extremities. Psychiatric: Normal mood and affect.   Assessment & Plan:   In summary, is a 24 year old male with 2 episodes of gross hematuria in the last week, the first associated with intercourse, and the second after a strenuous workout.  Both were completely painless, and resolved spontaneously.  We discussed common possible etiologies of hematuria including BPH, malignancy, urolithiasis, medical renal disease, and idiopathic. Standard workup recommended by the AUA includes imaging with CT urogram to assess the upper tracts, and cystoscopy. Cytology is performed on patient's with gross hematuria to look for malignant cells in the urine.  With his young age and hematuria associated with intercourse and strenuous workout, we discussed the option of deferring immediate work-up with CT and cystoscopy and repeating a urinalysis in 2 weeks.  If any recurrent gross hematuria or microscopic hematuria on UA in 2 weeks, I would strongly recommend pursuing full work-up with CT and cystoscopy.  He would like to defer work-up at this time.  We discussed the low, but not 0, risk of missing a malignancy or other clinically significant finding by deferring further work-up at this time.  He understands these risks and would like to proceed with close follow-up in 2 weeks with repeat UA.  RTC 2 weeks for repeat urinalysis, call if develops recurrent gross hematuria or any pain to pursue CT/cystoscopy  I spent 45 total minutes on the day of the encounter including pre-visit  review of the medical record, face-to-face time with the patient, and post visit ordering of labs/imaging/tests.  Nickolas Madrid, MD 12/12/2019  [facility] [address] [place], [place] [address] [phone]

## 2020-01-02 ENCOUNTER — Ambulatory Visit: Payer: PRIVATE HEALTH INSURANCE | Admitting: Urology

## 2020-01-03 ENCOUNTER — Encounter: Payer: Self-pay | Admitting: Urology

## 2020-10-18 HISTORY — PX: OTHER SURGICAL HISTORY: SHX169

## 2020-12-08 ENCOUNTER — Emergency Department
Admission: EM | Admit: 2020-12-08 | Discharge: 2020-12-08 | Disposition: A | Discharge status: Home / Self Care | Payer: Worker's Compensation | Attending: Emergency Medicine | Admitting: Emergency Medicine

## 2020-12-08 ENCOUNTER — Encounter: Payer: Self-pay | Admitting: Emergency Medicine

## 2020-12-08 ENCOUNTER — Other Ambulatory Visit: Payer: Self-pay

## 2020-12-08 DIAGNOSIS — Z03818 Encounter for observation for suspected exposure to other biological agents ruled out: Secondary | ICD-10-CM

## 2020-12-08 DIAGNOSIS — J4520 Mild intermittent asthma, uncomplicated: Secondary | ICD-10-CM | POA: Diagnosis not present

## 2020-12-08 DIAGNOSIS — Y990 Civilian activity done for income or pay: Secondary | ICD-10-CM | POA: Diagnosis not present

## 2020-12-08 DIAGNOSIS — Z20818 Contact with and (suspected) exposure to other bacterial communicable diseases: Secondary | ICD-10-CM | POA: Insufficient documentation

## 2020-12-08 LAB — CBC WITH DIFFERENTIAL/PLATELET
Abs Immature Granulocytes: 0.02 10*3/uL (ref 0.00–0.07)
Basophils Absolute: 0 10*3/uL (ref 0.0–0.1)
Basophils Relative: 0 %
Eosinophils Absolute: 0.2 10*3/uL (ref 0.0–0.5)
Eosinophils Relative: 2 %
HCT: 47.4 % (ref 39.0–52.0)
Hemoglobin: 16.3 g/dL (ref 13.0–17.0)
Immature Granulocytes: 0 %
Lymphocytes Relative: 37 %
Lymphs Abs: 2.9 10*3/uL (ref 0.7–4.0)
MCH: 30.4 pg (ref 26.0–34.0)
MCHC: 34.4 g/dL (ref 30.0–36.0)
MCV: 88.4 fL (ref 80.0–100.0)
Monocytes Absolute: 0.7 10*3/uL (ref 0.1–1.0)
Monocytes Relative: 9 %
Neutro Abs: 4.1 10*3/uL (ref 1.7–7.7)
Neutrophils Relative %: 52 %
Platelets: 236 10*3/uL (ref 150–400)
RBC: 5.36 MIL/uL (ref 4.22–5.81)
RDW: 12.9 % (ref 11.5–15.5)
WBC: 7.9 10*3/uL (ref 4.0–10.5)
nRBC: 0 % (ref 0.0–0.2)

## 2020-12-08 LAB — RAPID HIV SCREEN (HIV 1/2 AB+AG)
HIV 1/2 Antibodies: NONREACTIVE
HIV-1 P24 Antigen - HIV24: NONREACTIVE

## 2020-12-08 LAB — COMPREHENSIVE METABOLIC PANEL
ALT: 23 U/L (ref 0–44)
AST: 22 U/L (ref 15–41)
Albumin: 4.5 g/dL (ref 3.5–5.0)
Alkaline Phosphatase: 54 U/L (ref 38–126)
Anion gap: 11 (ref 5–15)
BUN: 15 mg/dL (ref 6–20)
CO2: 23 mmol/L (ref 22–32)
Calcium: 9.2 mg/dL (ref 8.9–10.3)
Chloride: 104 mmol/L (ref 98–111)
Creatinine, Ser: 0.79 mg/dL (ref 0.61–1.24)
GFR, Estimated: >60 mL/min (ref >60)
Glucose, Bld: 88 mg/dL (ref 70–99)
Potassium: 3.6 mmol/L (ref 3.5–5.1)
Sodium: 138 mmol/L (ref 135–145)
Total Bilirubin: 0.9 mg/dL (ref 0.3–1.2)
Total Protein: 7.8 g/dL (ref 6.5–8.1)

## 2020-12-08 LAB — RPR
RPR Ser Ql: REACTIVE — AB
RPR Titer: 1:1 {titer}

## 2020-12-08 LAB — HEPATITIS B SURFACE ANTIGEN: Hepatitis B Surface Ag: NONREACTIVE

## 2020-12-08 LAB — HEPATITIS C ANTIBODY: HCV Ab: NONREACTIVE

## 2020-12-08 MED ORDER — ELVITEG-COBIC-EMTRICIT-TENOFAF 150-150-200-10 MG PREPACK
1.0000 | ORAL_TABLET | Freq: Once | ORAL | Status: AC
  Administered 2020-12-08: 1 via ORAL
  Filled 2020-12-08 (×2): qty 1

## 2020-12-08 MED ORDER — ELVITEG-COBIC-EMTRICIT-TENOFAF 150-150-200-10 MG PO TABS: 1.0000 | ORAL_TABLET | Freq: Every day | ORAL | 0 refills | Status: DC

## 2020-12-08 NOTE — Discharge Instructions (Signed)
As we discussed, even though your risk of contracting HIV through your at-work exposure is relatively small, it is reasonable to proceed with prophylactic medications.  You were given a first dose in the emergency department and a prescription for a 28-day course which is the recommended treatment.  We do not have results yet on the patient who was the source, but your health at work team will help follow-up to provide additional recommendations.  Please read through the pharmacy medication that you will be given when you fill your prescription regarding potential side effects and follow-up as recommended according to your work protocol.
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
## 2020-12-09 LAB — T.PALLIDUM AB, TOTAL: T Pallidum Abs: NONREACTIVE

## 2021-08-10 DIAGNOSIS — S46219A Strain of muscle, fascia and tendon of other parts of biceps, unspecified arm, initial encounter: Secondary | ICD-10-CM

## 2021-08-10 HISTORY — DX: Strain of muscle, fascia and tendon of other parts of biceps, unspecified arm, initial encounter: S46.219A

## 2021-10-16 DIAGNOSIS — R6882 Decreased libido: Secondary | ICD-10-CM | POA: Diagnosis not present

## 2021-10-16 DIAGNOSIS — J4520 Mild intermittent asthma, uncomplicated: Secondary | ICD-10-CM | POA: Diagnosis not present

## 2021-10-16 DIAGNOSIS — Z0000 Encounter for general adult medical examination without abnormal findings: Secondary | ICD-10-CM | POA: Diagnosis not present

## 2021-10-16 DIAGNOSIS — F411 Generalized anxiety disorder: Secondary | ICD-10-CM | POA: Diagnosis not present

## 2021-10-16 HISTORY — DX: Decreased libido: R68.82

## 2021-11-16 DIAGNOSIS — S46212D Strain of muscle, fascia and tendon of other parts of biceps, left arm, subsequent encounter: Secondary | ICD-10-CM | POA: Diagnosis not present

## 2021-11-26 DIAGNOSIS — J209 Acute bronchitis, unspecified: Secondary | ICD-10-CM | POA: Diagnosis not present

## 2021-12-28 DIAGNOSIS — S46212D Strain of muscle, fascia and tendon of other parts of biceps, left arm, subsequent encounter: Secondary | ICD-10-CM | POA: Diagnosis not present

## 2022-02-24 DIAGNOSIS — M62461 Contracture of muscle, right lower leg: Secondary | ICD-10-CM | POA: Diagnosis not present

## 2022-02-24 DIAGNOSIS — M62830 Muscle spasm of back: Secondary | ICD-10-CM | POA: Diagnosis not present

## 2022-02-24 DIAGNOSIS — M5431 Sciatica, right side: Secondary | ICD-10-CM | POA: Diagnosis not present

## 2022-02-24 DIAGNOSIS — M9902 Segmental and somatic dysfunction of thoracic region: Secondary | ICD-10-CM | POA: Diagnosis not present

## 2022-02-24 DIAGNOSIS — M62451 Contracture of muscle, right thigh: Secondary | ICD-10-CM | POA: Diagnosis not present

## 2022-02-24 DIAGNOSIS — M9903 Segmental and somatic dysfunction of lumbar region: Secondary | ICD-10-CM | POA: Diagnosis not present

## 2022-02-26 DIAGNOSIS — M62461 Contracture of muscle, right lower leg: Secondary | ICD-10-CM | POA: Diagnosis not present

## 2022-02-26 DIAGNOSIS — M5431 Sciatica, right side: Secondary | ICD-10-CM | POA: Diagnosis not present

## 2022-02-26 DIAGNOSIS — M9902 Segmental and somatic dysfunction of thoracic region: Secondary | ICD-10-CM | POA: Diagnosis not present

## 2022-02-26 DIAGNOSIS — M9903 Segmental and somatic dysfunction of lumbar region: Secondary | ICD-10-CM | POA: Diagnosis not present

## 2022-02-26 DIAGNOSIS — M62830 Muscle spasm of back: Secondary | ICD-10-CM | POA: Diagnosis not present

## 2022-02-26 DIAGNOSIS — M62451 Contracture of muscle, right thigh: Secondary | ICD-10-CM | POA: Diagnosis not present

## 2022-03-01 DIAGNOSIS — M9903 Segmental and somatic dysfunction of lumbar region: Secondary | ICD-10-CM | POA: Diagnosis not present

## 2022-03-01 DIAGNOSIS — M62451 Contracture of muscle, right thigh: Secondary | ICD-10-CM | POA: Diagnosis not present

## 2022-03-01 DIAGNOSIS — M5431 Sciatica, right side: Secondary | ICD-10-CM | POA: Diagnosis not present

## 2022-03-01 DIAGNOSIS — M62830 Muscle spasm of back: Secondary | ICD-10-CM | POA: Diagnosis not present

## 2022-03-01 DIAGNOSIS — M62461 Contracture of muscle, right lower leg: Secondary | ICD-10-CM | POA: Diagnosis not present

## 2022-03-01 DIAGNOSIS — M9902 Segmental and somatic dysfunction of thoracic region: Secondary | ICD-10-CM | POA: Diagnosis not present

## 2022-06-09 DIAGNOSIS — R41840 Attention and concentration deficit: Secondary | ICD-10-CM | POA: Diagnosis not present

## 2022-06-09 DIAGNOSIS — Z23 Encounter for immunization: Secondary | ICD-10-CM | POA: Diagnosis not present

## 2022-06-09 DIAGNOSIS — Z6833 Body mass index (BMI) 33.0-33.9, adult: Secondary | ICD-10-CM | POA: Diagnosis not present

## 2023-02-27 DIAGNOSIS — H10023 Other mucopurulent conjunctivitis, bilateral: Secondary | ICD-10-CM | POA: Diagnosis not present

## 2023-08-17 ENCOUNTER — Ambulatory Visit: Payer: BC Managed Care – PPO | Admitting: Pediatrics

## 2023-08-17 ENCOUNTER — Encounter: Payer: Self-pay | Admitting: Pediatrics

## 2023-08-17 VITALS — BP 131/89 | HR 71 | Temp 97.8°F | Ht 73.0 in | Wt 248.2 lb

## 2023-08-17 DIAGNOSIS — F411 Generalized anxiety disorder: Secondary | ICD-10-CM | POA: Diagnosis not present

## 2023-08-17 DIAGNOSIS — S139XXA Sprain of joints and ligaments of unspecified parts of neck, initial encounter: Secondary | ICD-10-CM | POA: Insufficient documentation

## 2023-08-17 DIAGNOSIS — R41840 Attention and concentration deficit: Secondary | ICD-10-CM | POA: Diagnosis not present

## 2023-08-17 DIAGNOSIS — Z1330 Encounter for screening examination for mental health and behavioral disorders, unspecified: Secondary | ICD-10-CM

## 2023-08-17 DIAGNOSIS — S62329A Displaced fracture of shaft of unspecified metacarpal bone, initial encounter for closed fracture: Secondary | ICD-10-CM

## 2023-08-17 DIAGNOSIS — F3341 Major depressive disorder, recurrent, in partial remission: Secondary | ICD-10-CM | POA: Diagnosis not present

## 2023-08-17 HISTORY — DX: Displaced fracture of shaft of unspecified metacarpal bone, initial encounter for closed fracture: S62.329A

## 2023-08-17 NOTE — Patient Instructions (Signed)
Good to meet you! Welcome to [facility]!  As your primary care doctor, I look forward to working with you to help you reach your health goals.  Please be aware of a couple of logistical items: - If you message me on mychart, it may take me 1-2 business days to get back to you. This is for non-urgent messaging.  - If you require urgent clinical attention, please call the clinic or present to urgent care/emergency room - If you have labs, I typically will send a message about them in 1-2 business days. - I am not here on Mondays, otherwise will be available from Tuesday-Friday during 8a-5pm.

## 2023-08-17 NOTE — Progress Notes (Signed)
Establish Care Note  BP 131/89   Pulse 71   Temp 97.8 F (36.6 C) (Oral)   Ht 6\' 1"  (1.854 m)   Wt 248 lb 3.2 oz (112.6 kg)   SpO2 98%   BMI 32.75 kg/m    Subjective:    Patient ID: [name], male    DOB: [birth year], 27 y.o.   MRN: [id number]  HPI: Adam Clayton is a 27 y.o. male  Chief Complaint  Patient presents with   Establish Care    Patient is looking to get help for ADHD testing     Establishing care, the following was discussed today:  Discussed the use of AI scribe software for clinical note transcription with the patient, who gave verbal consent to proceed.  History of Present Illness   The patient, previously diagnosed with ADHD in childhood, presents with concerns about worsening symptoms affecting his work and personal life. He reports increasing forgetfulness, difficulty focusing, and feeling "all over the place," which he attributes to his ADHD. 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 He also reports that these symptoms are negatively impacting his relationship due to his poor memory.  The patient has been on sertraline for anxiety for approximately ten years, with the last five years being consistent daily use. He reports that this medication has been effective in managing his anxiety. He was previously trialed on Wellbutrin by his primary care doctor, but he did not notice any improvement in his ADHD symptoms. He also reports having an inhaler for asthma, but it is not regularly used.  The patient was diagnosed with ADHD as a child, but was not medicated due to parental disagreement about the diagnosis and treatment. He expresses some gratitude for this, but now, as an adult, he is seeking treatment for his symptoms. He reports no other significant medical history or current medications.       #HM Immunizations: declined covid,  flu  Relevant past medical, surgical, family and social history reviewed and updated as indicated. Interim medical history since our last visit reviewed. Allergies and medications reviewed and updated.  ROS per HPI unless specifically indicated above     Objective:    BP 131/89   Pulse 71   Temp 97.8 F (36.6 C) (Oral)   Ht 6\' 1"  (1.854 m)   Wt 248 lb 3.2 oz (112.6 kg)   SpO2 98%   BMI 32.75 kg/m   Wt Readings from Last 3 Encounters:  08/17/23 248 lb 3.2 oz (112.6 kg)  12/12/19 270 lb 9.6 oz (122.7 kg)  12/11/19 265 lb (120.2 kg)     Physical Exam Constitutional:      Appearance: Normal appearance.  HENT:     Head: Normocephalic and atraumatic.  Eyes:     Pupils: Pupils are equal, round, and reactive to light.  Cardiovascular:     Rate and Rhythm: Normal rate and regular rhythm.     Pulses: Normal pulses.     Heart sounds: Normal heart sounds.  Pulmonary:     Effort: Pulmonary effort is normal.     Breath sounds: Normal breath sounds.  Musculoskeletal:        General: Normal range of motion.     Cervical back: Normal range of motion.  Skin:    General: Skin is warm and dry.  Neurological:     General: No focal deficit present.  Mental Status: He is alert. Mental status is at baseline.  Psychiatric:        Mood and Affect: Mood normal.        Behavior: Behavior normal.         08/17/2023    9:26 AM  Depression screen PHQ 2/9  Decreased Interest 1  Down, Depressed, Hopeless 1  PHQ - 2 Score 2  Altered sleeping 1  Tired, decreased energy 1  Change in appetite 0  Feeling bad or failure about yourself  1  Trouble concentrating 3  Moving slowly or fidgety/restless 3  Suicidal thoughts 0  PHQ-9 Score 11  Difficult doing work/chores Somewhat difficult       08/17/2023    9:26 AM  GAD 7 : Generalized Anxiety Score  Nervous, Anxious, on Edge 1  Control/stop worrying 1  Worry too much - different things 1  Trouble relaxing 1  Restless 2  Easily  annoyed or irritable 1  Afraid - awful might happen 1  Total GAD 7 Score 8  Anxiety Difficulty Somewhat difficult   Adult ADHD Self Report Scale (most recent)     Adult ADHD Self-Report Scale (ASRS-v1.1) Symptom Checklist - 08/17/23 0952       Part A   1. How often do you have trouble wrapping up the final details of a project, once the challenging parts have been done? Very Often  2. How often do you have difficulty getting things done in order when you have to do a task that requires organization? Very Often    3. How often do you have problems remembering appointments or obligations? Very Often  4. When you have a task that requires a lot of thought, how often do you avoid or delay getting started? Often    5. How often do you fidget or squirm with your hands or feet when you have to sit down for a long time? Very Often  6. How often do you feel overly active and compelled to do things, like you were driven by a motor? Very Often      Part B   7. How often do you make careless mistakes when you have to work on a boring or difficult project? Very Often  8. How often do you have difficulty keeping your attention when you are doing boring or repetitive work? Very Often    9. How often do you have difficulty concentrating on what people say to you, even when they are speaking to you directly? Very Often  10. How often do you misplace or have difficulty finding things at home or at work? Very Often    11. How often are you distracted by activity or noise around you? Very Often  12. How often do you leave your seat in meetings or other situations in which you are expected to remain seated? Very Often    13. How often do you feel restless or fidgety? Very Often  14. How often do you have difficulty unwinding and relaxing when you have time to yourself? Very Often    15. How often do you find yourself talking too much when you are in social situations? Often  16. When you are in a conversation, how  often do you find yourself finishing the sentences of the people you are talking to, before they can finish them themselves? Often    17. How often do you have difficulty waiting your turn in situations when turn taking is required? Sometimes  18. How often do you interrupt others when they are busy? Often                 Assessment & Plan:  Assessment & Plan   GAD (generalized anxiety disorder) MDD (major depressive disorder), recurrent, in partial remission (HCC) Managed on Sertraline 100mg  daily for the past five years with good control of symptoms. Welbutrin did not have any benefit. No safety concerns at this time. -Continue Sertraline 100mg  daily.  Attention and concentration deficit History of childhood diagnosis, currently experiencing worsening symptoms affecting work and personal relationships. Previous trial of Wellbutrin without significant improvement. Suspect treating underlying ADHD will help his mood symptoms above. -Schedule formal ADHD evaluation via video visit on 08/18/2023. Completed self report cale today - POSITIVE. -Discuss treatment options post-evaluation, including potential stimulant medications.   Encounter for behavioral health screening As part of their intake evaluation, the patient was screened for depression, anxiety.  PHQ9 SCORE 11, GAD7 SCORE 8. Screening results positive for tested conditions. See plan under problem/diagnosis above.  Reviewed patient record including history, medications, problem list. Will schedule for physical after tomorrow's evaluation for ADHD.   Follow up plan: Return in about 1 day (around 08/18/2023) for adhd evaluation, (virtual).  Timonthy Hovater Howell Pringle, MD

## 2023-08-18 ENCOUNTER — Encounter: Payer: Self-pay | Admitting: Pediatrics

## 2023-08-18 ENCOUNTER — Telehealth (INDEPENDENT_AMBULATORY_CARE_PROVIDER_SITE_OTHER): Payer: BC Managed Care – PPO | Admitting: Pediatrics

## 2023-08-18 DIAGNOSIS — F902 Attention-deficit hyperactivity disorder, combined type: Secondary | ICD-10-CM | POA: Diagnosis not present

## 2023-08-18 DIAGNOSIS — F411 Generalized anxiety disorder: Secondary | ICD-10-CM | POA: Diagnosis not present

## 2023-08-18 DIAGNOSIS — F3341 Major depressive disorder, recurrent, in partial remission: Secondary | ICD-10-CM

## 2023-08-18 MED ORDER — SERTRALINE HCL 100 MG PO TABS: 150.0000 mg | ORAL_TABLET | Freq: Every day | ORAL | 2 refills | Status: DC

## 2023-08-18 MED ORDER — AMPHETAMINE-DEXTROAMPHET ER 10 MG PO CP24
10.0000 mg | ORAL_CAPSULE | Freq: Every day | ORAL | 0 refills | Status: DC
Start: 2023-08-18 — End: 2023-09-13

## 2023-08-18 NOTE — Progress Notes (Signed)
Appointment has been made

## 2023-08-18 NOTE — Progress Notes (Unsigned)
Telehealth Visit  I connected with  Adam Clayton on 08/21/23 by a video enabled telemedicine application and verified that I am speaking with the correct person using two identifiers.   I discussed the limitations of evaluation and management by telemedicine. The patient expressed understanding and agreed to proceed.  Subjective:    Patient ID: Adam Clayton, male    DOB: 1996-04-27, 27 y.o.   MRN: [id number]  HPI: Adam Clayton is a 27 y.o. male  Chief Complaint  Patient presents with   ADHD    Evaluation    SUBJECTIVE  Patient presents today with a complaint of poor concentration/focus. He reports increased concern about ability to perform at work, has also started to impact relationships.  He reports that symptoms began in childhood, did not have medication treatment at that time ago.  He has been diagnosed with ADHD previously in childhood.  He reports that his problem with concentration affects the following functional areas: work, relationship, family life. He endorses the following symptoms:   Hyperactivity(6/9 for children, 5/9 for adults)  [x]  Often fidgets with or taps hands or feet or squirms in seat  [x]  Difficulty remaining seated when sitting is required (eg, at school, work, Catering manager)  [x]  Feelings of restlessness (in adolescents/adults) or inappropriate running around or climbing in younger children  [x]  Difficulty playing or doing leisure activities quietly  [x]  Difficult to keep up with, seeming to always be "on the go" (may be experienced by others as being restless or difficult to keep up with)  [x]  Excessive talking  [x]  Difficulty waiting turns (while in line for example)  [x]  Blurting out answers too quickly (completes people' sentences; cannot wait for turn in conversation)  [x]  Interruption or intrusion of others (may intrude into or take over what others are doing)  Inattention (6/9 for children, 5/9 for adults) [x]  Failure to provide close attention to detail,  careless mistakes [x]  Difficulty maintaining attention in play, school, or home activities (difficulty remaining focused during lectures, conversations, or lengthy reading) [x]  Seems not to listen, even when directly addressed [x]  Fails to follow through  (ex: homework, chores, workplace duties, misses deadlines) [x]  Difficulty organizing tasks, activities, and belongings (difficulty managing sequential tasks) [x]  Avoids tasks that require consistent mental effort (preparing reports, completing forms, reviewing lengthy papers) [x]  Loses objects required for tasks or activities (eg, school books, sports equipment, phones, etc) [x]  Easily distracted by irrelevant stimuli (maybe unrelated thoughts) [x]  Forgetfulness in routine activities (eg, homework, chores, paying bills, keeping appointments, etc)  Sleep: - 5-6 hours of sleep a night, varies depending - trazadone- never recommended - inability to resta  In regards to mood the patient reports:   Depression: Patient complains of depression. He complains of {depression symptoms:1002}.   Family history significant for {fam hx:15335}.Possible organic causes contributing are: {possible organic causes:15339}.  Risk factors: {depression risk factors:1001} Previous treatment includes {anxiety treatments:15336} and {depression treatment:1010}. He complains of the following side effects from the treatment: {side effects:15372}.  In regards to anxiety the patient reports:   Anxiety: Patient complains of anxiety disorder.  He has the following symptoms: {anxiety sx:15334}. Onset of symptoms was approximately { 0-10:33138} {time units:11} ago, {clinical course - history:17::"unchanged"} since that time. He denies current suicidal and homicidal ideation. Family history significant for {fam hx:15335}.Possible organic causes contributing are: {possible organic causes:15339}. Risk factors: {depression risk factors:1001} Previous treatment includes {anxiety  treatments:15336} and {depression treatment:1010}.  He complains of the following side effects from the treatment: {side  effects:15372}.  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 Takes him "forever to complete" paperwork Has off weeks  Mental Health Family Hx:  - mom: has multiple mental diagnosis - dad: depression, ADHD - brother: depression, admission - no bipolar, psych  Past Psychiatric History:  - MDD, Anxiety, ADHD (childhood)   Substance Use:  None Does not drink  Cardiac History:  Uncontrolled HTN: no Arrhythmias: no Structural heart disease: no  Review of Systems:  Card: no palpitations Neuro: chronic intermittent insomnia insomnia, no headaches Psychiatric: no worsening anxiety GI: no stomach aches  Relevant past medical, surgical, family and social history reviewed and updated as indicated. Interim medical history since our last visit reviewed. Allergies and medications reviewed and updated.  ROS per HPI unless specifically indicated above     Objective:    There were no vitals taken for this visit.  Wt Readings from Last 3 Encounters:  08/17/23 248 lb 3.2 oz (112.6 kg)  12/12/19 270 lb 9.6 oz (122.7 kg)  12/11/19 265 lb (120.2 kg)     Physical Exam   LIMITED EXAM GIVEN VIDEO VISIT     Assessment & Plan:  Assessment & Plan   Attention deficit hyperactivity disorder (ADHD), combined type -     Amphetamine-Dextroamphet ER; Take 1 capsule (10 mg total) by mouth daily.  Dispense: 30 capsule; Refill: 0  MDD (major depressive disorder), recurrent, in partial remission (HCC) -     Sertraline HCl; Take 1.5 tablets (150 mg total) by mouth daily.  Dispense: 45 tablet; Refill: 2  GAD (generalized anxiety disorder) -     Sertraline HCl; Take 1.5 tablets (150 mg total) by mouth daily.  Dispense: 45 tablet; Refill: 2      Follow up plan: Return in about 26 days (around 09/13/2023) for ADHD,  (virtual).  Chiron Campione P Shelie Lansing, MD   This visit was completed via video visit through {Blank single:19197::"MyChart","FaceTime","Skype","Doximity","***"} due to the restrictions of the COVID-19 pandemic. All issues as above were discussed and addressed. Physical exam was done as above through visual confirmation on video through {Blank single:19197::"MyChart","FaceTime","Skype","Doximity","***"}. If it was felt that the patient should be evaluated in the office, they were directed there. The patient verbally consented to this visit."} Location of the patient: {Blank single:19197::"home","work","parking lot","***"} Location of the provider: {Blank single:19197::"home","work"} Those involved with this call:  Provider: Modena Nunnery, MD CMA: {Blank single:19197::"Tiffany Reel, CMA","Brittany Perlie Gold, CMA","Cristina Dimas, CMA","Tammy Daveluy, CMA","Destiny Knight, CMA"} Time spent on call: {Blank single:19197::"*** minutes on the phone discussing health concerns. *** minutes total spent in review of patient's record and preparation of their chart.","*** minutes with patient face to face via video conference. More than 50% of this time was spent in counseling and coordination of care. *** minutes total spent in review of patient's record and preparation of their chart."}

## 2023-08-18 NOTE — Patient Instructions (Addendum)
Start Adderall 10MG  extended release - to use during work days, try to give 1-2 days off medication a week. The medication lasts between 8-10 hours and will act within the first hour of taking it.  Main side effects to look out for: headaches, jitteriness, worsened anxiety, worsened insomnia, increased irritability or aggression. high thirst, and low appetite. If you find these are worsening, please reach out sooner that our follow up visit for a check in.   We are also increasing your sertraline to 150mg . I sent a new prescription for 1.5 tabs daily. You can cut it in half on your own, some people prefer to buy a pill cutter at a pharmacy but whatever works best for you.  Check out this website for helpful behavioral modifications and other helpful information:  http://www.parks.biz/  I will see you in 1 month!

## 2023-08-23 ENCOUNTER — Encounter: Payer: Self-pay | Admitting: Pediatrics

## 2023-08-23 NOTE — Assessment & Plan Note (Signed)
Plan to increase sertraline dose to 150mg  given recent life stressors. Continue to monitor.

## 2023-08-23 NOTE — Assessment & Plan Note (Addendum)
Meets criteria by self questionnaire. Today, completed formal evaluation, patient meets criteria for ADHD, combined symptoms. There may additionally be some degree of anxiety and depression contributing but stronger suspicion that anxiety and mood symptoms will improve with ADHD treatment. Provided resources on behavioral modifications as well. Will start 10mg  adderral and evaluate in 30 days. Of note, has h/o insomnia which may be due to underlying mood disorder but instructed to monitor since starting stimulant medication.

## 2023-09-13 ENCOUNTER — Telehealth: Payer: BC Managed Care – PPO | Admitting: Pediatrics

## 2023-09-13 ENCOUNTER — Encounter: Payer: Self-pay | Admitting: Pediatrics

## 2023-09-13 VITALS — Ht 74.0 in | Wt 245.0 lb

## 2023-09-13 DIAGNOSIS — F902 Attention-deficit hyperactivity disorder, combined type: Secondary | ICD-10-CM | POA: Diagnosis not present

## 2023-09-13 DIAGNOSIS — F3341 Major depressive disorder, recurrent, in partial remission: Secondary | ICD-10-CM | POA: Diagnosis not present

## 2023-09-13 DIAGNOSIS — F411 Generalized anxiety disorder: Secondary | ICD-10-CM

## 2023-09-13 MED ORDER — SERTRALINE HCL 100 MG PO TABS: 150.0000 mg | ORAL_TABLET | Freq: Every day | ORAL | 3 refills | Status: DC

## 2023-09-13 MED ORDER — AMPHETAMINE-DEXTROAMPHET ER 15 MG PO CP24
15.0000 mg | ORAL_CAPSULE | Freq: Every day | ORAL | 0 refills | Status: DC
Start: 2023-09-13 — End: 2023-10-14

## 2023-09-13 NOTE — Progress Notes (Signed)
Telehealth Visit  I connected with  Adam Clayton on 09/13/23 by a video enabled telemedicine application and verified that I am speaking with the correct person using two identifiers.   I discussed the limitations of evaluation and management by telemedicine. The patient expressed understanding and agreed to proceed.  Subjective:    Patient ID: Adam Clayton, male    DOB: April 22, 1996, 27 y.o.   MRN: [id number]  HPI: Adam Clayton is a 28 y.o. male  Chief Complaint  Patient presents with   ADHD    No other concerns   #ADHD Follow up ADHD: patient is currently treated with Adderall XR 10 mg. He believes that the medication is effective. He feels he is able to perform better at work and at home/social settings. He feels the first 3 weeks good control. The last week, felt some deficits again. First 2 weeks did not take any days off the medication. Taking 1-2 days off. He felt. Reports the following side effects: none. Some degree appetite changed initially, but now resolved otherwise no concerns. Able  #MDD #GAD Also increased zoloft to 150mg . He feels it has controlled the anxiety very well. He feels the best he has felt in a long time.  No safety concerns Taking daily as prescribed  Relevant past medical, surgical, family and social history reviewed and updated as indicated. Interim medical history since our last visit reviewed. Allergies and medications reviewed and updated.  ROS per HPI unless specifically indicated above     Objective:    Ht 6\' 2"  (1.88 m)   Wt 245 lb (111.1 kg)   BMI 31.46 kg/m   Wt Readings from Last 3 Encounters:  09/13/23 245 lb (111.1 kg)  08/17/23 248 lb 3.2 oz (112.6 kg)  12/12/19 270 lb 9.6 oz (122.7 kg)     Physical Exam Constitutional:      General: He is not in acute distress.    Appearance: He is normal weight. He is not ill-appearing.  Pulmonary:     Effort: Pulmonary effort is normal.  Neurological:     General: No focal deficit  present.     Mental Status: He is alert. Mental status is at baseline.  Psychiatric:        Mood and Affect: Mood normal.        Behavior: Behavior normal.      LIMITED EXAM GIVEN VIDEO VISIT     Assessment & Plan:  Assessment & Plan   Attention deficit hyperactivity disorder (ADHD), combined type Assessment & Plan: Patient doing well with ADHD treatment. Has additionally helped with other mood disorders. No side effects reported. Feels in the last week, had some breakthrough symptoms. Plan to increase dose adderrall dose to 15mg  and encouraged taking holidays (1-2 days a week) off the medication. Follow up in 1 month.  Orders: -     Amphetamine-Dextroamphet ER; Take 1 capsule by mouth daily.  Dispense: 31 capsule; Refill: 0  GAD (generalized anxiety disorder) MDD (major depressive disorder), recurrent, in partial remission (HCC) Assessment & Plan: Improved with higher dose of zoloft and ADHD treatment. Continue sertraline 150mg . Continue to monitor.  Orders: -     Sertraline HCl; Take 1.5 tablets (150 mg total) by mouth daily.  Dispense: 135 tablet; Refill: 3  Follow up plan: Return in about 4 weeks (around 10/11/2023) for ADHD.  Adam Clayton P Adam Prioleau, MD   This visit was completed via video visit through MyChart due to the restrictions of the COVID-19 pandemic. All  issues as above were discussed and addressed. Physical exam was done as above through visual confirmation on video through MyChart. If it was felt that the patient should be evaluated in the office, they were directed there. The patient verbally consented to this visit."} Location of the patient: work Location of the provider: work Those involved with this call:  Provider: Modena Nunnery, MD CMA:  Oswaldo Conroy, CMA Time spent on call:  10 minutes on the phone discussing health concerns. 20 minutes total spent in review of patient's record and preparation of their chart.

## 2023-09-13 NOTE — Assessment & Plan Note (Signed)
Improved with higher dose of zoloft and ADHD treatment. Continue sertraline 150mg . Continue to monitor.

## 2023-09-13 NOTE — Patient Instructions (Addendum)
Increase to 15mg  of Adderall. Try to take 1-2 days off the medicine a week. Continue zoloft at 150mg  (1.5 pills)  Follow up on 12/27 at 8am

## 2023-09-13 NOTE — Assessment & Plan Note (Signed)
Patient doing well with ADHD treatment. Has additionally helped with other mood disorders. No side effects reported. Feels in the last week, had some breakthrough symptoms. Plan to increase dose adderrall dose to 15mg  and encouraged taking holidays (1-2 days a week) off the medication. Follow up in 1 month.

## 2023-09-30 NOTE — Telephone Encounter (Signed)
Changed patient's appointment to a physical and for it to be in person.

## 2023-10-10 ENCOUNTER — Other Ambulatory Visit: Payer: Self-pay | Admitting: Pediatrics

## 2023-10-10 DIAGNOSIS — F3341 Major depressive disorder, recurrent, in partial remission: Secondary | ICD-10-CM

## 2023-10-10 DIAGNOSIS — F411 Generalized anxiety disorder: Secondary | ICD-10-CM

## 2023-10-10 MED ORDER — SERTRALINE HCL 100 MG PO TABS: 150.0000 mg | ORAL_TABLET | Freq: Every day | ORAL | 3 refills | Status: DC

## 2023-10-10 NOTE — Progress Notes (Signed)
Updated sertraline rx  Erbie Arment Howell Pringle, MD

## 2023-10-14 ENCOUNTER — Encounter: Payer: Self-pay | Admitting: Pediatrics

## 2023-10-14 ENCOUNTER — Ambulatory Visit (INDEPENDENT_AMBULATORY_CARE_PROVIDER_SITE_OTHER): Payer: BC Managed Care – PPO | Admitting: Pediatrics

## 2023-10-14 ENCOUNTER — Other Ambulatory Visit: Payer: Self-pay | Admitting: Pediatrics

## 2023-10-14 VITALS — BP 153/89 | HR 87 | Temp 98.7°F | Resp 16 | Wt 245.0 lb

## 2023-10-14 DIAGNOSIS — R030 Elevated blood-pressure reading, without diagnosis of hypertension: Secondary | ICD-10-CM | POA: Diagnosis not present

## 2023-10-14 DIAGNOSIS — F902 Attention-deficit hyperactivity disorder, combined type: Secondary | ICD-10-CM | POA: Diagnosis not present

## 2023-10-14 DIAGNOSIS — Z1330 Encounter for screening examination for mental health and behavioral disorders, unspecified: Secondary | ICD-10-CM | POA: Diagnosis not present

## 2023-10-14 DIAGNOSIS — F3341 Major depressive disorder, recurrent, in partial remission: Secondary | ICD-10-CM | POA: Diagnosis not present

## 2023-10-14 DIAGNOSIS — Z0000 Encounter for general adult medical examination without abnormal findings: Secondary | ICD-10-CM | POA: Diagnosis not present

## 2023-10-14 MED ORDER — AMPHETAMINE-DEXTROAMPHET ER 15 MG PO CP24
15.0000 mg | ORAL_CAPSULE | Freq: Every day | ORAL | 0 refills | Status: DC
Start: 2023-10-14 — End: 2023-11-14

## 2023-10-14 MED ORDER — AMPHETAMINE-DEXTROAMPHETAMINE 5 MG PO TABS: 5.0000 mg | ORAL_TABLET | Freq: Every day | ORAL | 0 refills | Status: DC

## 2023-10-14 NOTE — Assessment & Plan Note (Addendum)
Reports ongoing improvement of symptoms on 15mg  XR adderall. We discussed a variety of options including increasing dose, adding short acting or switching to short acting to give more flexibility in managing symptoms. Plan to start short acting for later in the day. Additionally encouraged to try to take only short acting to see if this was more effective. Of note, had elevated BP today, discussed could be a side effect. Will continue to monitor given attributed to recent stressors. May additionally be influenced by nicotine patches. Follow up 2-3 weeks.

## 2023-10-14 NOTE — Assessment & Plan Note (Signed)
Well controlled on zoloft 150mg . Continue to monitor.

## 2023-10-14 NOTE — Progress Notes (Signed)
Pt needs TB screen and urinalysis for work physical. Notified clinical team.  Jackolyn Confer, MD

## 2023-10-14 NOTE — Patient Instructions (Addendum)
For ADHD: Plan to continue current dose of long acting 15mg  Trial short acting later in the day, I sent 5mg  to take for later in the day with the long acting One of your days try the short acting 5mg  (once or twice a day) to see if you do better on the short acting alone. We can go up on that dose if needed  Measure your blood pressure at home! Home Blood Pressure Monitoring is an important part of managing blood pressure and thought to be more accurate than the measures we get in the clinic.  Here's some tips on how to take your blood pressure accurately at home and some highly rated monitors. Most insurances (except for Medicaid) won't pay for monitors, so unfortunately they are an out-of-pocket expense for most people.  Taking an accurate blood pressure measurement: To get an accurate blood pressure reading, empty your bladder first, then rest in a seated position for at least 5 minutes. Ideally, no caffeine or tobacco use in last 30 minutes. Use an arm cuff (not wrist - see recommendations below) seated in a chair with a back next to a table or object that is high enough that you can rest your arm so the blood pressure cuff is at the level of your heart and you can lean back comfortably. Keep both feet on the floor and don't talk while the machine is working. Checking at different times of the day can be helpful to get an idea of your average numbers. Your goal blood pressure should be <140/90. BP Monitor Ratings: Here are some top rated Blood Pressure Monitors as tested by Consumer Reports (accessed January, 2024): (*BB) Best buy = highly rated with lower price Rating Brand/Model Estimated Cost  86 [employer]ron Platinum BP5450 $79  85 Omron Silver BP5250 (*BB)  $53  84 Omron 10 Series BP7450 $92  83 Omron Evolv BP7000 $110  81 [employer] [id number] $52  80 Omron 3 Series BP7100 $50  78 [employer]Health [id number] $40  76 [employer] ([employer]) Automatic Upper Arm 48-554 $30  Note: all units listed above are arm cuffs  which are more accurate than wrist cuffs. For more information (and the source of the below info-graphic on how to get set up to get an accurate reading) - check out: [employer]be

## 2023-10-14 NOTE — Assessment & Plan Note (Signed)
Multifactorial in setting of new stimulant use, uses nicotine pouches, and recent life stressors. Instructed to check at home, if ongoing elevation, consider switching stimulant.

## 2023-10-14 NOTE — Progress Notes (Signed)
BP (!) 153/89 (BP Location: Left Wrist, Cuff Size: Normal)   Pulse 87   Temp 98.7 F (37.1 C) (Oral)   Resp 16   Wt 245 lb (111.1 kg)   SpO2 99%   BMI 31.46 kg/m    Annual Physical Exam - Male  Subjective:   CC: Employment Physical and ADHD (Going good. Working but would consider upping dose again)   Adam Clayton is a 27 y.o. male patient here for a preventative health maintenance exam. Additional topics discussed include:  #ADHD Follow up ADHD: patient is currently treated with Adderall XR 15 mg. He believes that the medication is effective. Lasted longer at the beginning. Took time off of it this past month and could help. Reports the following side effects: none. Feels some breathrough symptoms later in the day. Interested in adjustments to help with breakthrough symptoms.  Health Habits: DIET: in general, a "healthy" diet   EXERCISE: 4-6 times/week on average, activities include cardiovascular workout on exercise equipment DENTAL EXAM: overdue, will order today EYE EXAM: not applicable                               Sex History:  Sexually active : Yes with male. Barrier protection : n/a      Reports some concerns over decreased libido No erectile dysfunction or inability to achieve orgasm  No family history on file.                    Social History   Tobacco Use   Smoking status: Never   Smokeless tobacco: Never  Vaping Use   Vaping status: Every Day   Substances: Nicotine  Substance Use Topics   Alcohol use: Yes    Comment: 3 times a week   Drug use: No   Social History   Social History Narrative   Not on file    Social drivers questionnaire is reviewed and is positive for : none. Follow up: none   Depression Screening:     10/14/2023    8:10 AM 09/13/2023    8:16 AM 08/18/2023   11:25 AM 08/17/2023    9:26 AM  Depression screen PHQ 2/9  Decreased Interest 0 0 0 1  Down, Depressed, Hopeless 0 0 0 1  PHQ - 2 Score 0 0 0 2  Altered  sleeping 0 1  1  Tired, decreased energy 0 0  1  Change in appetite 1 0  0  Feeling bad or failure about yourself  1 0  1  Trouble concentrating 1 0  3  Moving slowly or fidgety/restless 0 0  3  Suicidal thoughts 0 0  0  PHQ-9 Score 3 1  11   Difficult doing work/chores Somewhat difficult   Somewhat difficult        10/14/2023    8:10 AM 09/13/2023    8:15 AM 08/17/2023    9:26 AM  GAD 7 : Generalized Anxiety Score  Nervous, Anxious, on Edge 1 1 1   Control/stop worrying 0 1 1  Worry too much - different things 1 1 1   Trouble relaxing 1 1 1   Restless 2 3 2   Easily annoyed or irritable 0 0 1  Afraid - awful might happen 1 0 1  Total GAD 7 Score 6 7 8   Anxiety Difficulty Somewhat difficult  Somewhat difficult      Depression Severity and Treatment Recommendations:  0-4= None  5-9= Mild / Treatment: Support, educate to call if worse; return in one month  10-14= Moderate / Treatment: Support, watchful waiting; Antidepressant or Psychotherapy  15-19= Moderately severe / Treatment: Antidepressant OR Psychotherapy  >= 20= Major depression, severe / Antidepressant AND Psychotherapy  Mental Health Plan:  see below. Continue zoloft 150mg . ADHD treatment discussed below.  Health Maintenance Colon Cancer Screening : not applicable due to age Prostate Cancer Screening :  not applicable due to age Immunizations : up to date and documented  Self Management Goals  Goals   None     Review of Systems Per HPI        Current Outpatient Medications (Other):    amphetamine-dextroamphetamine (ADDERALL) 5 MG tablet, Take 1 tablet (5 mg total) by mouth daily.   sertraline (ZOLOFT) 100 MG tablet, Take 1.5 tablets (150 mg total) by mouth daily.   amphetamine-dextroamphetamine (ADDERALL XR) 15 MG 24 hr capsule, Take 1 capsule by mouth daily.   Patient Active Problem List   Diagnosis Date Noted   Elevated blood pressure reading 10/14/2023   Attention deficit hyperactivity disorder  (ADHD), combined type 08/18/2023   MDD (major depressive disorder), recurrent, in partial remission (HCC) 02/07/2019   GAD (generalized anxiety disorder) 02/06/2019   Mild intermittent asthma 04/13/2011     Objective:   Vitals:   10/14/23 0812 10/14/23 0814  BP: (!) 154/97 (!) 153/89  Pulse: 87   Temp: 98.7 F (37.1 C)   Resp: 16   Weight: 245 lb (111.1 kg)   SpO2: 99%   TempSrc: Oral     Physical Exam Constitutional:      Appearance: Normal appearance.  HENT:     Head: Normocephalic and atraumatic.     Nose: Nose normal.  Eyes:     Pupils: Pupils are equal, round, and reactive to light.  Cardiovascular:     Rate and Rhythm: Normal rate and regular rhythm.     Pulses: Normal pulses.     Heart sounds: Normal heart sounds.  Pulmonary:     Effort: Pulmonary effort is normal.     Breath sounds: Normal breath sounds.  Musculoskeletal:        General: Normal range of motion.     Cervical back: Normal range of motion.  Skin:    General: Skin is warm and dry.     Capillary Refill: Capillary refill takes less than 2 seconds.  Neurological:     General: No focal deficit present.     Mental Status: He is alert. Mental status is at baseline.  Psychiatric:        Mood and Affect: Mood normal.        Behavior: Behavior normal.    Hearing Screening   125Hz  250Hz  500Hz  1000Hz  2000Hz   Right ear Pass Pass Pass Pass Pass  Left ear Pass Pass Pass Pass Pass   Vision Screening   Right eye Left eye Both eyes  Without correction 20/20 20/20 20/13   With correction       Assessment and Plan:   Annual physical exam Discussed lifestyle modifications and goals including plant based eating styles (such as: Mediterranean eating style), regular exercise (at least 150 min of moderate-intensity aerobic exercise per week, given AHA workout handout), get adequate sleep, and continue working with PCP towards meeting health goals to ensure healthy aging. Completed work vision and  hearing screen.  Attention deficit hyperactivity disorder (ADHD), combined type Assessment & Plan: Reports ongoing improvement of symptoms on 15mg  XR adderall.  We discussed a variety of options including increasing dose, adding short acting or switching to short acting to give more flexibility in managing symptoms. Plan to start short acting for later in the day. Additionally encouraged to try to take only short acting to see if this was more effective. Of note, had elevated BP today, discussed could be a side effect. Will continue to monitor given attributed to recent stressors. May additionally be influenced by nicotine patches. Follow up 2-3 weeks.  Orders: -     Amphetamine-Dextroamphet ER; Take 1 capsule by mouth daily.  Dispense: 31 capsule; Refill: 0 -     Amphetamine-Dextroamphetamine; Take 1 tablet (5 mg total) by mouth daily.  Dispense: 45 tablet; Refill: 0  MDD (major depressive disorder), recurrent, in partial remission (HCC) Assessment & Plan: Well controlled on zoloft 150mg . Continue to monitor.   Elevated blood pressure reading Assessment & Plan: Multifactorial in setting of new stimulant use, uses nicotine pouches, and recent life stressors. Instructed to check at home, if ongoing elevation, consider switching stimulant.   Encounter for behavioral health screening As part of their intake evaluation, the patient was screened for depression, anxiety.  PHQ9 SCORE 3, GAD7 SCORE 6. Screening results positive for tested conditions. See plan under problem/diagnosis above.  This plan was discussed with the patient and questions were answered. There were no further concerns.  Follow up as indicated, or sooner should any new problems arise, if conditions worsen, or if they are otherwise concerned.   See patient instructions for additional information.  Coury Grieger Howell Pringle, MD Family Medicine       Future Appointments  Date Time Provider Department Center  11/01/2023  8:40 AM  Jackolyn Confer, MD [facility]

## 2023-10-17 ENCOUNTER — Other Ambulatory Visit: Payer: Self-pay

## 2023-10-17 ENCOUNTER — Telehealth: Payer: Self-pay | Admitting: Pediatrics

## 2023-10-17 ENCOUNTER — Other Ambulatory Visit: Payer: BC Managed Care – PPO

## 2023-10-17 DIAGNOSIS — Z0000 Encounter for general adult medical examination without abnormal findings: Secondary | ICD-10-CM

## 2023-10-17 DIAGNOSIS — F902 Attention-deficit hyperactivity disorder, combined type: Secondary | ICD-10-CM

## 2023-10-17 LAB — URINALYSIS, ROUTINE W REFLEX MICROSCOPIC
Bilirubin, UA: NEGATIVE
Glucose, UA: NEGATIVE
Ketones, UA: NEGATIVE
Leukocytes,UA: NEGATIVE
Nitrite, UA: NEGATIVE
Protein,UA: NEGATIVE
RBC, UA: NEGATIVE
Specific Gravity, UA: 1.015 (ref 1.005–1.030)
Urobilinogen, Ur: 0.2 mg/dL (ref 0.2–1.0)
pH, UA: 7 (ref 5.0–7.5)

## 2023-10-17 MED ORDER — AMPHETAMINE-DEXTROAMPHET ER 15 MG PO CP24: 15.0000 mg | ORAL_CAPSULE | Freq: Every day | ORAL | 0 refills | Status: DC

## 2023-10-17 NOTE — Telephone Encounter (Signed)
Patient unable to talk now but plans to call back shortly.

## 2023-10-17 NOTE — Telephone Encounter (Unsigned)
Copied from CRM [id number]. Topic: General - Inquiry >> Oct 17, 2023  9:03 AM Haroldine Laws wrote: Reason for CRM: pt called saying the [facility] in [place] does not have his adderall but the one in [place] does.  He is asking that it be sent to the [facility] in [place].  CB@  [phone]

## 2023-10-17 NOTE — Telephone Encounter (Signed)
Patient returned called for [facility]. Please f/u with patient

## 2023-10-18 NOTE — Telephone Encounter (Signed)
Spoke with patient in office. No further needs.

## 2023-10-18 NOTE — Progress Notes (Signed)
Completed and messaged patient to pick up his paperwork.

## 2023-11-01 ENCOUNTER — Telehealth (INDEPENDENT_AMBULATORY_CARE_PROVIDER_SITE_OTHER): Payer: BC Managed Care – PPO | Admitting: Pediatrics

## 2023-11-01 ENCOUNTER — Encounter: Payer: Self-pay | Admitting: Pediatrics

## 2023-11-01 DIAGNOSIS — F3341 Major depressive disorder, recurrent, in partial remission: Secondary | ICD-10-CM | POA: Diagnosis not present

## 2023-11-01 DIAGNOSIS — F902 Attention-deficit hyperactivity disorder, combined type: Secondary | ICD-10-CM

## 2023-11-01 DIAGNOSIS — F411 Generalized anxiety disorder: Secondary | ICD-10-CM | POA: Diagnosis not present

## 2023-11-01 DIAGNOSIS — Z1330 Encounter for screening examination for mental health and behavioral disorders, unspecified: Secondary | ICD-10-CM | POA: Diagnosis not present

## 2023-11-01 MED ORDER — AMPHETAMINE-DEXTROAMPHET ER 15 MG PO CP24: 15.0000 mg | ORAL_CAPSULE | Freq: Every day | ORAL | 0 refills | Status: DC

## 2023-11-01 MED ORDER — AMPHETAMINE-DEXTROAMPHETAMINE 5 MG PO TABS: 5.0000 mg | ORAL_TABLET | Freq: Every day | ORAL | 0 refills | Status: DC

## 2023-11-01 MED ORDER — AMPHETAMINE-DEXTROAMPHET ER 15 MG PO CP24: 15.0000 mg | ORAL_CAPSULE | ORAL | 0 refills | Status: DC

## 2023-11-01 NOTE — Patient Instructions (Addendum)
 Plan on 3 month follow up Continue to take 1-2 days off the medication per week  If any issues come up before our next follow up, please let me know  Your next fill will be available 1/30.

## 2023-11-01 NOTE — Assessment & Plan Note (Signed)
 Well controlled on zoloft 150mg  daily. Continue current regimen.

## 2023-11-01 NOTE — Assessment & Plan Note (Addendum)
 Stable on current regimen of 15mg  extended release Adderall and 5mg  short acting Adderall. Patient reports flexibility and efficacy with current regimen. Sent 3 month supply. Will fill on 27th of every month. -Continue current medication regimen. -Encourage patient to continue taking medication breaks as tolerated. -Follow up in three months or sooner if issues arise.

## 2023-11-01 NOTE — Progress Notes (Signed)
 Left message on machine for patient to call the office and schedule a  follow up appt

## 2023-11-01 NOTE — Progress Notes (Signed)
 Telehealth Visit  I connected with  Adam Clayton on 11/01/23 by a video enabled telemedicine application and verified that I am speaking with the correct person using two identifiers.   I discussed the limitations of evaluation and management by telemedicine. The patient expressed understanding and agreed to proceed.  Subjective:    Patient ID: Adam Clayton, male    DOB: April 08, 1996, 28 y.o.   MRN: [id number]  HPI: [name] is a 28 y.o. male  Chief Complaint  Patient presents with   ADHD    ASRS completed in flowsheets with patient.    Discussed the use of AI scribe software for clinical note transcription with the patient, who gave verbal consent to proceed.  History of Present Illness   The patient, on a regimen of 15mg  extended-release and 5mg  short-acting Adderall, reports satisfaction with the current medication plan. He notes a decrease in the use of short-acting medication over time, with usage now largely dependent on the demands of his workday. He appreciates the flexibility this regimen offers, allowing him to adjust medication use based on daily needs. He also reports taking occasional breaks from the medication, which he believes contributes to its continued effectiveness. The patient experienced a recent issue with medication availability at his usual pharmacy, necessitating a switch to a different location. He requests future prescriptions be sent to this new pharmacy. No new symptoms or concerns were reported.      Adult ADHD Self Report Scale (most recent)     Adult ADHD Self-Report Scale (ASRS-v1.1) Symptom Checklist - 11/01/23 0800       Part A   1. How often do you have trouble wrapping up the final details of a project, once the challenging parts have been done? Often  2. How often do you have difficulty getting things done in order when you have to do a task that requires organization? Often    3. How often do you have problems remembering appointments or  obligations? Rarely  4. When you have a task that requires a lot of thought, how often do you avoid or delay getting started? Rarely    5. How often do you fidget or squirm with your hands or feet when you have to sit down for a long time? Very Often  6. How often do you feel overly active and compelled to do things, like you were driven by a motor? Sometimes      Part B   7. How often do you make careless mistakes when you have to work on a boring or difficult project? Rarely  8. How often do you have difficulty keeping your attention when you are doing boring or repetitive work? Sometimes    9. How often do you have difficulty concentrating on what people say to you, even when they are speaking to you directly? Rarely  10. How often do you misplace or have difficulty finding things at home or at work? Rarely    11. How often are you distracted by activity or noise around you? Rarely  12. How often do you leave your seat in meetings or other situations in which you are expected to remain seated? Rarely    13. How often do you feel restless or fidgety? Sometimes  14. How often do you have difficulty unwinding and relaxing when you have time to yourself? Sometimes    15. How often do you find yourself talking too much when you are in social situations? Rarely  16.  When you are in a conversation, how often do you find yourself finishing the sentences of the people you are talking to, before they can finish them themselves? Sometimes    17. How often do you have difficulty waiting your turn in situations when turn taking is required? Rarely  18. How often do you interrupt others when they are busy? Rarely      Comment   How old were you when these problems first began to occur? 9              Relevant past medical, surgical, family and social history reviewed and updated as indicated. Interim medical history since our last visit reviewed. Allergies and medications reviewed and updated.  ROS per  HPI unless specifically indicated above     Objective:    There were no vitals taken for this visit.  Wt Readings from Last 3 Encounters:  10/14/23 245 lb (111.1 kg)  09/13/23 245 lb (111.1 kg)  08/17/23 248 lb 3.2 oz (112.6 kg)     Physical Exam Constitutional:      General: He is not in acute distress.    Appearance: He is normal weight. He is not ill-appearing.  Pulmonary:     Effort: Pulmonary effort is normal.  Neurological:     General: No focal deficit present.     Mental Status: He is alert. Mental status is at baseline.  Psychiatric:        Mood and Affect: Mood normal.        Behavior: Behavior normal.      LIMITED EXAM GIVEN VIDEO VISIT     Assessment & Plan:  Assessment & Plan   Attention deficit hyperactivity disorder (ADHD), combined type Assessment & Plan: Stable on current regimen of 15mg  extended release Adderall and 5mg  short acting Adderall. Patient reports flexibility and efficacy with current regimen. Sent 3 month supply. Will fill on 27th of every month. -Continue current medication regimen. -Encourage patient to continue taking medication breaks as tolerated. -Follow up in three months or sooner if issues arise.   Orders: -     Amphetamine -Dextroamphet ER; Take 1 capsule by mouth daily.  Dispense: 30 capsule; Refill: 0 -     Amphetamine -Dextroamphetamine ; Take 1 tablet (5 mg total) by mouth daily.  Dispense: 30 tablet; Refill: 0 -     Amphetamine -Dextroamphet ER; Take 1 capsule by mouth daily.  Dispense: 30 capsule; Refill: 0 -     Amphetamine -Dextroamphetamine ; Take 1 tablet (5 mg total) by mouth daily.  Dispense: 30 tablet; Refill: 0 -     Amphetamine -Dextroamphetamine ; Take 1 tablet (5 mg total) by mouth daily.  Dispense: 30 tablet; Refill: 0 -     Amphetamine -Dextroamphet ER; Take 1 capsule by mouth every morning.  Dispense: 30 capsule; Refill: 0  MDD (major depressive disorder), recurrent, in partial remission (HCC) Assessment & Plan: Well  controlled on zoloft  150mg  daily. Continue current regimen.   GAD (generalized anxiety disorder) Assessment & Plan: Resolved/controlled sx since starting ADHD treatment. Continue to monitor.   Encounter for behavioral health screening ASRS improved, completed as part of intake today. See above for plan.   Follow up plan: Return in about 3 months (around 01/30/2024) for ADHD, (virtual).  Turki Tapanes P Ryka Beighley, MD   This visit was completed via video visit through MyChart due to the restrictions of the COVID-19 pandemic. All issues as above were discussed and addressed. Physical exam was done as above through visual confirmation on video through MyChart. If  it was felt that the patient should be evaluated in the office, they were directed there. The patient verbally consented to this visit.} Location of the patient: home Location of the provider: work Those involved with this call:  Provider: Hadassah Nett, MD CMA:  Comer Sarah, CMA Time spent on call:  10 minutes with patient face to face via video conference. More than 50% of this time was spent in counseling and coordination of care. 10 minutes total spent in review of patient's record and preparation of their chart. Total time spent on this encounter: 20 minutes.

## 2023-11-01 NOTE — Assessment & Plan Note (Addendum)
 Resolved/controlled sx since starting ADHD treatment. Continue to monitor.

## 2023-11-08 DIAGNOSIS — J069 Acute upper respiratory infection, unspecified: Secondary | ICD-10-CM | POA: Diagnosis not present

## 2023-11-11 ENCOUNTER — Telehealth: Payer: Self-pay | Admitting: Pediatrics

## 2023-11-11 NOTE — Telephone Encounter (Signed)
Patient dropped off document  Psychological medical treatment Information , to be filled out by provider. Patient requested to send it back via Call Patient to pick up within 5-days. Document is located in providers folder. Please advise at Mobile [phone] (mobile)

## 2023-11-11 NOTE — Telephone Encounter (Signed)
Form prefilled and placed in providers folder for completion.

## 2024-01-11 ENCOUNTER — Telehealth: Payer: Self-pay

## 2024-01-11 NOTE — Telephone Encounter (Signed)
 Copied from CRM [id number]. Topic: Clinical - Prescription Issue >> Jan 10, 2024 12:29 PM Adam Clayton wrote: Reason for CRM: Patient states he understands the prescriptions would be filled with [facility] on 3/27, but his concern is he will be at work and also [facility] usually is a day late with his refills and he will be a full day without medication he is asking if the prescription could be called in early so he can pick it up on time with out missing a dose  Usually Natalia Leatherwood Alexx Giambra CMA helps him with this concern  [id number] - [place], [place] - [address] [place] [address] Phone: [phone] Fax: [phone]

## 2024-01-11 NOTE — Telephone Encounter (Signed)
 Already completed and message sent to patient that he is all set as of yesterday.

## 2024-01-31 ENCOUNTER — Telehealth: Payer: Self-pay | Admitting: Pediatrics

## 2024-02-01 ENCOUNTER — Telehealth: Payer: Self-pay

## 2024-02-01 NOTE — Telephone Encounter (Signed)
 Following up with patient and left message for yesterdays missed appointment. Please schedule patient with visit for ADHD follow up with PCP.

## 2024-02-10 ENCOUNTER — Telehealth (INDEPENDENT_AMBULATORY_CARE_PROVIDER_SITE_OTHER): Payer: Self-pay | Admitting: Pediatrics

## 2024-02-10 ENCOUNTER — Encounter: Payer: Self-pay | Admitting: Pediatrics

## 2024-02-10 DIAGNOSIS — F411 Generalized anxiety disorder: Secondary | ICD-10-CM | POA: Diagnosis not present

## 2024-02-10 DIAGNOSIS — F3341 Major depressive disorder, recurrent, in partial remission: Secondary | ICD-10-CM

## 2024-02-10 DIAGNOSIS — F902 Attention-deficit hyperactivity disorder, combined type: Secondary | ICD-10-CM | POA: Diagnosis not present

## 2024-02-10 MED ORDER — AMPHETAMINE-DEXTROAMPHET ER 15 MG PO CP24: 15.0000 mg | ORAL_CAPSULE | Freq: Every day | ORAL | 0 refills | Status: DC

## 2024-02-10 MED ORDER — AMPHETAMINE-DEXTROAMPHET ER 15 MG PO CP24: 15.0000 mg | ORAL_CAPSULE | ORAL | 0 refills | Status: DC

## 2024-02-10 NOTE — Assessment & Plan Note (Signed)
 Current regimen effective but tolerance developing. Mood and sleep stable. - Continue medication at 15 mg. - Advised to contact if tolerance increases before next follow-up. - Consider alternating higher doses if needed. - Discussed potential switch to another stimulant formulation if necessary.

## 2024-02-10 NOTE — Patient Instructions (Signed)
 Continue current regimen

## 2024-02-10 NOTE — Assessment & Plan Note (Signed)
 Well controlled on sertraline  150mg . Continue current regimen.

## 2024-02-10 NOTE — Progress Notes (Addendum)
 Telehealth Visit  I connected with  Adam Clayton on 02/10/24 by a video enabled telemedicine application and verified that I am speaking with the correct person using two identifiers.   I discussed the limitations of evaluation and management by telemedicine. The patient expressed understanding and agreed to proceed.  Subjective:    Patient ID: Adam Clayton, male    DOB: 05/21/1996, 28 y.o.   MRN: [id number]  HPI: Adam Clayton is a 28 y.o. male  Chief Complaint  Patient presents with   ADHD    Going well but does feel at times he is becoming more tolerable of it. Ongoing 30 days. Still does work.     Discussed the use of AI scribe software for clinical note transcription with the patient, who gave verbal consent to proceed.  History of Present Illness   Adam Clayton is a 28 year old male who presents for medication management.  He reports that his current medication regimen is effective, although he has noticed some tolerance developing over the past month. He is currently on a dose of 15 mg, which he finds satisfactory, but he is open to adjustments if necessary. He uses an additional 5 mg dose as needed, though less frequently than when he first started the medication. He sometimes forgets he has the extra doses available.  He confirms that he is taking breaks from the medication one or two days a week. He has not experienced any significant issues with mood or sleep, indicating that both have been stable. He mentions a previous issue with picking up his prescription at [facility] due to it being a controlled substance, but this should not be a problem moving forward.  No significant issues with mood or sleep.         Relevant past medical, surgical, family and social history reviewed and updated as indicated. Interim medical history since our last visit reviewed. Allergies and medications reviewed and updated.  ROS per HPI unless specifically indicated above      Objective:    There were no vitals taken for this visit.  Wt Readings from Last 3 Encounters:  10/14/23 245 lb (111.1 kg)  09/13/23 245 lb (111.1 kg)  08/17/23 248 lb 3.2 oz (112.6 kg)     Physical Exam Constitutional:      General: He is not in acute distress.    Appearance: He is normal weight. He is not ill-appearing.  Pulmonary:     Effort: Pulmonary effort is normal.  Neurological:     General: No focal deficit present.     Mental Status: He is alert. Mental status is at baseline.  Psychiatric:        Mood and Affect: Mood normal.        Behavior: Behavior normal.      LIMITED EXAM GIVEN VIDEO VISIT     Assessment & Plan:  Assessment & Plan   Attention deficit hyperactivity disorder (ADHD), combined type Assessment & Plan: Current regimen effective but tolerance developing. Mood and sleep stable. - Continue medication at 15 mg. - Advised to contact if tolerance increases before next follow-up. - Consider alternating higher doses if needed. - Discussed potential switch to another stimulant formulation if necessary.  Orders: -     Amphetamine -Dextroamphet ER; Take 1 capsule by mouth daily.  Dispense: 30 capsule; Refill: 0 -     Amphetamine -Dextroamphet ER; Take 1 capsule by mouth daily.  Dispense: 30 capsule; Refill: 0 -     Amphetamine -Dextroamphet ER; Take  1 capsule by mouth every morning.  Dispense: 30 capsule; Refill: 0  GAD (generalized anxiety disorder) MDD (major depressive disorder), recurrent, in partial remission (HCC) Assessment & Plan: Well controlled on sertraline  150mg . Continue current regimen.   Follow up plan: 3 months  Hadassah Letters, MD   This visit was completed via video visit through MyChart due to the restrictions of the COVID-19 pandemic. All issues as above were discussed and addressed. Physical exam was done as above through visual confirmation on video through MyChart. If it was felt that the patient should be evaluated in the  office, they were directed there. The patient verbally consented to this visit. Location of the patient: home Location of the provider: work Those involved with this call:  Provider: Geraldine Kling, MD CMA: Danella Dunn, CMA Time spent on call:  10 minutes with patient face to face via video conference. More than 50% of this time was spent in counseling and coordination of care. 20 minutes total spent in review of patient's record and preparation of their chart. Total time spent on this encounter: 30 minutes.

## 2024-02-14 ENCOUNTER — Other Ambulatory Visit: Payer: Self-pay

## 2024-02-14 DIAGNOSIS — F902 Attention-deficit hyperactivity disorder, combined type: Secondary | ICD-10-CM

## 2024-02-15 MED ORDER — AMPHETAMINE-DEXTROAMPHET ER 15 MG PO CP24: 15.0000 mg | ORAL_CAPSULE | Freq: Every day | ORAL | 0 refills | Status: DC

## 2024-02-15 MED ORDER — AMPHETAMINE-DEXTROAMPHETAMINE 5 MG PO TABS: 5.0000 mg | ORAL_TABLET | Freq: Every day | ORAL | 0 refills | Status: DC | PRN

## 2024-02-16 ENCOUNTER — Other Ambulatory Visit: Payer: Self-pay

## 2024-02-16 DIAGNOSIS — F902 Attention-deficit hyperactivity disorder, combined type: Secondary | ICD-10-CM

## 2024-02-17 MED ORDER — AMPHETAMINE-DEXTROAMPHET ER 15 MG PO CP24: 15.0000 mg | ORAL_CAPSULE | Freq: Every morning | ORAL | 0 refills | Status: DC

## 2024-03-13 ENCOUNTER — Other Ambulatory Visit: Payer: Self-pay

## 2024-03-13 DIAGNOSIS — F411 Generalized anxiety disorder: Secondary | ICD-10-CM

## 2024-03-13 DIAGNOSIS — F3341 Major depressive disorder, recurrent, in partial remission: Secondary | ICD-10-CM

## 2024-03-13 MED ORDER — SERTRALINE HCL 100 MG PO TABS
150.0000 mg | ORAL_TABLET | Freq: Every day | ORAL | 3 refills | Status: DC
Start: 2024-03-13 — End: 2024-05-14

## 2024-04-11 ENCOUNTER — Other Ambulatory Visit: Payer: Self-pay | Admitting: Pediatrics

## 2024-04-11 DIAGNOSIS — F3341 Major depressive disorder, recurrent, in partial remission: Secondary | ICD-10-CM

## 2024-04-11 DIAGNOSIS — F411 Generalized anxiety disorder: Secondary | ICD-10-CM

## 2024-04-12 NOTE — Telephone Encounter (Signed)
 Requested Prescriptions  Refused Prescriptions Disp Refills   sertraline  (ZOLOFT ) 100 MG tablet [Pharmacy Med Name: SERTRALINE  100MG  TABLETS] 45 tablet     Sig: TAKE 1 AND 1/2 TABLETS(150 MG) BY MOUTH DAILY     Psychiatry:  Antidepressants - SSRI - sertraline  Failed - 04/12/2024  2:10 PM      Failed - AST in normal range and within 360 days    AST  Date Value Ref Range Status  12/08/2020 22 15 - 41 U/L Final         Failed - ALT in normal range and within 360 days    ALT  Date Value Ref Range Status  12/08/2020 23 0 - 44 U/L Final         Failed - Valid encounter within last 6 months    Recent Outpatient Visits           2 months ago Attention deficit hyperactivity disorder (ADHD), combined type   [facility] Herold Hadassah SQUIBB, MD       Future Appointments             In 3 weeks Herold Hadassah SQUIBB, MD [facility]            Passed - Completed PHQ-2 or PHQ-9 in the last 360 days

## 2024-04-30 NOTE — Telephone Encounter (Signed)
 Called patient left message for patient to call office and schedule appt.

## 2024-05-01 NOTE — Telephone Encounter (Signed)
 Patient is already scheduled for 7/22

## 2024-05-08 ENCOUNTER — Encounter: Payer: Self-pay | Admitting: Pediatrics

## 2024-05-08 ENCOUNTER — Telehealth (INDEPENDENT_AMBULATORY_CARE_PROVIDER_SITE_OTHER): Payer: Self-pay | Admitting: Pediatrics

## 2024-05-08 DIAGNOSIS — R400 Somnolence: Secondary | ICD-10-CM | POA: Diagnosis not present

## 2024-05-08 DIAGNOSIS — F3341 Major depressive disorder, recurrent, in partial remission: Secondary | ICD-10-CM

## 2024-05-08 DIAGNOSIS — F411 Generalized anxiety disorder: Secondary | ICD-10-CM

## 2024-05-08 DIAGNOSIS — F902 Attention-deficit hyperactivity disorder, combined type: Secondary | ICD-10-CM | POA: Diagnosis not present

## 2024-05-08 DIAGNOSIS — R0683 Snoring: Secondary | ICD-10-CM

## 2024-05-08 NOTE — Progress Notes (Signed)
 Telehealth Visit  I connected with  Adam Clayton on 05/14/24 by a video enabled telemedicine application and verified that I am speaking with the correct person using two identifiers.   I discussed the limitations of evaluation and management by telemedicine. The patient expressed understanding and agreed to proceed.  Subjective:    Patient ID: Adam Clayton, male    DOB: 06/25/1996, 28 y.o.   MRN: [id number]  HPI: Adam Clayton is a 28 y.o. male  Chief Complaint  Patient presents with   ADHD    Discussed the use of AI scribe software for clinical note transcription with the patient, who gave verbal consent to proceed.  History of Present Illness   Adam Clayton is a 28 year old male who presents with concerns about medication tolerance and potential sleep apnea.  He feels he is developing a tolerance to his current medication regimen. He primarily uses the long-acting formulation and did not receive a prescription for the short-acting version due to issues with the pharmacy. The short-acting medication is effective but is a small dose and does not last long. He is open to trying a higher dose of the short-acting medication.  He has been on sertraline  (Zoloft ) for a while and is considering increasing the dose due to recent life stressors. He describes his life as a 'roller coaster' with many events happening back-to-back, which he believes could be better managed with an increased dose of sertraline .  His girlfriend has observed that he wakes up gasping for air and has certain types of snoring. He does not recall these episodes but acknowledges waking up to breathe and then returning to sleep. He does not typically feel excessively sleepy during the day, although he has occasionally fallen asleep while watching TV or at the movies. His girlfriend often reminds him not to fall asleep during these activities.  He is currently taking Adderall, which affects his perception of daytime  sleepiness. He mentions that his girlfriend often tells him to stay awake during TV shows, indicating that he may experience some level of daytime sleepiness.      STOPBAND:  Do you snore loudly (louder than talking or loud enough to be heard through closed doors)?  Yes Do you often feel tired, fatigued, or sleepy during daytime?  Yes Has anyone observed you stop breathing during your sleep?  Yes Do you have or are you being treated for high blood pressure?  No BMI more than 35 kg/m2?  No Age over 50 years?  No Neck circumference greater than 40 cm?  UNKNOWN Male gender?  Yes  Total Yes Answers: 4     05/08/2024    4:00 PM  Results of the Epworth flowsheet  Sitting and reading 2  Watching TV 2  Sitting, inactive in a public place (e.g. a theatre or a meeting) 3  As a passenger in a car for an hour without a break 1  Lying down to rest in the afternoon when circumstances permit 1  Sitting and talking to someone 1  Sitting quietly after a lunch without alcohol 1  In a car, while stopped for a few minutes in traffic 0  Total score 11     Relevant past medical, surgical, family and social history reviewed and updated as indicated. Interim medical history since our last visit reviewed. Allergies and medications reviewed and updated.  ROS per HPI unless specifically indicated above     Objective:    There were no vitals taken for  this visit.  Wt Readings from Last 3 Encounters:  10/14/23 245 lb (111.1 kg)  09/13/23 245 lb (111.1 kg)  08/17/23 248 lb 3.2 oz (112.6 kg)     Physical Exam Constitutional:      General: He is not in acute distress.    Appearance: He is normal weight. He is not ill-appearing.  Pulmonary:     Effort: Pulmonary effort is normal.  Neurological:     General: No focal deficit present.     Mental Status: He is alert. Mental status is at baseline.  Psychiatric:        Mood and Affect: Mood normal.        Behavior: Behavior normal.       LIMITED EXAM GIVEN VIDEO VISIT     Assessment & Plan:  Assessment & Plan   MDD (major depressive disorder), recurrent, in partial remission (HCC) GAD (generalized anxiety disorder) Assessment & Plan: He is experiencing significant life stressors and is open to medication adjustment. Discussed the possibility of genetic testing if increasing sertraline  proves ineffective. Increase sertraline  (Zoloft ) to 200 mg. Consider genetic testing if symptoms persist after the dose increase.  Orders: -     Sertraline  HCl; Take 2 tablets (200 mg total) by mouth daily.  Dispense: 180 tablet; Refill: 3  Attention deficit hyperactivity disorder (ADHD), combined type Assessment & Plan: He has developed some degree of tolerance to the current long-acting medication dose. Prefers long-acting medication but is open to a higher short-acting dose for better symptom management. Increase long-acting medication to 20 mg and prescribe 10 mg of short-acting medication for flexibility. Encouraged breaks off medication.  Orders: -     Amphetamine -Dextroamphet ER; Take 1 capsule (20 mg total) by mouth in the morning.  Dispense: 30 capsule; Refill: 0 -     Amphetamine -Dextroamphetamine ; Take 1 tablet (10 mg total) by mouth daily as needed.  Dispense: 30 tablet; Refill: 0  Daytime sleepiness Snores Gasping during sleep and daytime sleepiness suggest sleep apnea. Testing is recommended to prevent cardiovascular and mood complications. Order a home sleep apnea test. Epworth 11, STOPBANG 4. -     Ambulatory referral to Sleep Studies   Follow up plan: Return in about 3 months (around 08/08/2024) for ADHD.  Hadassah SHAUNNA Nett, MD   This visit was completed via video visit through MyChart due to the restrictions of the COVID-19 pandemic. All issues as above were discussed and addressed. Physical exam was done as above through visual confirmation on video through MyChart. If it was felt that the patient should be  evaluated in the office, they were directed there. The patient verbally consented to this visit.} Location of the patient: home Location of the provider: work Those involved with this call:  Provider: Hadassah Nett, MD CMA: Cena Maffucci, CMA Time spent on call: 15 minutes with patient face to face via video conference. More than 50% of this time was spent in counseling and coordination of care. 15 minutes total spent in review of patient's record and preparation of their chart. Total time spent on this encounter: 30 minutes.

## 2024-05-14 ENCOUNTER — Encounter: Payer: Self-pay | Admitting: Pediatrics

## 2024-05-14 MED ORDER — AMPHETAMINE-DEXTROAMPHET ER 20 MG PO CP24: 20.0000 mg | ORAL_CAPSULE | Freq: Every morning | ORAL | 0 refills | Status: DC

## 2024-05-14 MED ORDER — AMPHETAMINE-DEXTROAMPHETAMINE 10 MG PO TABS: 10.0000 mg | ORAL_TABLET | Freq: Every day | ORAL | 0 refills | Status: DC | PRN

## 2024-05-14 MED ORDER — SERTRALINE HCL 100 MG PO TABS: 200.0000 mg | ORAL_TABLET | Freq: Every day | ORAL | 3 refills | Status: DC

## 2024-05-14 NOTE — Assessment & Plan Note (Signed)
 He is experiencing significant life stressors and is open to medication adjustment. Discussed the possibility of genetic testing if increasing sertraline  proves ineffective. Increase sertraline  (Zoloft ) to 200 mg. Consider genetic testing if symptoms persist after the dose increase.

## 2024-05-14 NOTE — Assessment & Plan Note (Addendum)
 He has developed some degree of tolerance to the current long-acting medication dose. Prefers long-acting medication but is open to a higher short-acting dose for better symptom management. Increase long-acting medication to 20 mg and prescribe 10 mg of short-acting medication for flexibility. Encouraged breaks off medication.

## 2024-06-14 ENCOUNTER — Other Ambulatory Visit: Payer: Self-pay | Admitting: Pediatrics

## 2024-06-14 DIAGNOSIS — F902 Attention-deficit hyperactivity disorder, combined type: Secondary | ICD-10-CM

## 2024-06-14 NOTE — Telephone Encounter (Signed)
 Copied from CRM [id number]. Topic: Clinical - Medication Refill >> Jun 14, 2024  2:10 PM Carla L wrote: Medication: amphetamine -dextroamphetamine  (ADDERALL XR) 20 MG 24 hr capsule  amphetamine -dextroamphetamine  (ADDERALL) 10 MG tablet   Has the patient contacted their pharmacy? Yes Told to contact the office.   This is the patient's preferred pharmacy:  [id number] - [place], [place] - [address] [place] [address] Phone: [phone] Fax: [phone]  Is this the correct pharmacy for this prescription? Yes  Has the prescription been filled recently? No  Is the patient out of the medication? Yes  Has the patient been seen for an appointment in the last year OR does the patient have an upcoming appointment? Yes  Can we respond through MyChart? Yes  Agent: Please be advised that Rx refills may take up to 3 business days. We ask that you follow-up with your pharmacy.

## 2024-06-15 NOTE — Telephone Encounter (Signed)
 Requested medications are due for refill today.  yes  Requested medications are on the active medications list.  yes  Last refill. 05/14/2024 #30 0 rf  Future visit scheduled.   yes  Notes to clinic.  Refill not delegated.    Requested Prescriptions  Pending Prescriptions Disp Refills   amphetamine -dextroamphetamine  (ADDERALL XR) 20 MG 24 hr capsule 30 capsule 0    Sig: Take 1 capsule (20 mg total) by mouth in the morning.     Not Delegated - Psychiatry:  Stimulants/ADHD Failed - 06/15/2024  5:03 PM      Failed - This refill cannot be delegated      Failed - Urine Drug Screen completed in last 360 days      Failed - Last BP in normal range    BP Readings from Last 1 Encounters:  10/14/23 (!) 153/89         Passed - Last Heart Rate in normal range    Pulse Readings from Last 1 Encounters:  10/14/23 87         Passed - Valid encounter within last 6 months    Recent Outpatient Visits           1 month ago MDD (major depressive disorder), recurrent, in partial remission (HCC)   [place] [facility] Herold Hadassah SQUIBB, MD   4 months ago Attention deficit hyperactivity disorder (ADHD), combined type   [facility] Herold Hadassah SQUIBB, MD       Future Appointments             In 3 months Herold, Hadassah SQUIBB, MD [place] [facility], [address]             amphetamine -dextroamphetamine  (ADDERALL) 10 MG tablet 30 tablet 0    Sig: Take 1 tablet (10 mg total) by mouth daily as needed.     Not Delegated - Psychiatry:  Stimulants/ADHD Failed - 06/15/2024  5:03 PM      Failed - This refill cannot be delegated      Failed - Urine Drug Screen completed in last 360 days      Failed - Last BP in normal range    BP Readings from Last 1 Encounters:  10/14/23 (!) 153/89         Passed - Last Heart Rate in normal range    Pulse Readings from Last 1 Encounters:  10/14/23 87         Passed - Valid encounter within last 6  months    Recent Outpatient Visits           1 month ago MDD (major depressive disorder), recurrent, in partial remission (HCC)   [place] Herold Hadassah SQUIBB, MD   4 months ago Attention deficit hyperactivity disorder (ADHD), combined type   [place] Herold Hadassah SQUIBB, MD       Future Appointments             In 3 months Herold, Hadassah SQUIBB, MD [facility], [address]

## 2024-06-19 ENCOUNTER — Other Ambulatory Visit: Payer: Self-pay | Admitting: Pediatrics

## 2024-06-22 ENCOUNTER — Other Ambulatory Visit: Payer: Self-pay | Admitting: Pediatrics

## 2024-06-22 DIAGNOSIS — F902 Attention-deficit hyperactivity disorder, combined type: Secondary | ICD-10-CM

## 2024-06-22 MED ORDER — AMPHETAMINE-DEXTROAMPHET ER 20 MG PO CP24: 20.0000 mg | ORAL_CAPSULE | Freq: Every morning | ORAL | 0 refills | Status: DC

## 2024-06-22 MED ORDER — AMPHETAMINE-DEXTROAMPHETAMINE 10 MG PO TABS: 10.0000 mg | ORAL_TABLET | Freq: Every day | ORAL | 0 refills | Status: DC | PRN

## 2024-06-22 NOTE — Progress Notes (Signed)
 Sent IR and XR adderall overdue for refills.  Hadassah SHAUNNA Nett, MD

## 2024-06-29 ENCOUNTER — Other Ambulatory Visit: Payer: Self-pay | Admitting: Pediatrics

## 2024-06-29 DIAGNOSIS — F3341 Major depressive disorder, recurrent, in partial remission: Secondary | ICD-10-CM

## 2024-06-29 DIAGNOSIS — F411 Generalized anxiety disorder: Secondary | ICD-10-CM

## 2024-06-29 MED ORDER — SERTRALINE HCL 100 MG PO TABS: 200.0000 mg | ORAL_TABLET | Freq: Every day | ORAL | 3 refills | Status: DC

## 2024-06-29 NOTE — Progress Notes (Signed)
 Sent zoloft  refills  Adam SHAUNNA Nett, MD

## 2024-07-13 ENCOUNTER — Telehealth: Payer: Self-pay

## 2024-07-13 ENCOUNTER — Other Ambulatory Visit: Payer: Self-pay | Admitting: Pediatrics

## 2024-07-13 ENCOUNTER — Encounter: Payer: Self-pay | Admitting: Pediatrics

## 2024-07-13 ENCOUNTER — Telehealth: Admitting: Pediatrics

## 2024-07-13 DIAGNOSIS — F3341 Major depressive disorder, recurrent, in partial remission: Secondary | ICD-10-CM | POA: Diagnosis not present

## 2024-07-13 DIAGNOSIS — F5104 Psychophysiologic insomnia: Secondary | ICD-10-CM | POA: Insufficient documentation

## 2024-07-13 DIAGNOSIS — F902 Attention-deficit hyperactivity disorder, combined type: Secondary | ICD-10-CM

## 2024-07-13 DIAGNOSIS — F411 Generalized anxiety disorder: Secondary | ICD-10-CM | POA: Diagnosis not present

## 2024-07-13 MED ORDER — AMPHETAMINE-DEXTROAMPHET ER 20 MG PO CP24: 20.0000 mg | ORAL_CAPSULE | Freq: Every morning | ORAL | 0 refills | Status: DC

## 2024-07-13 MED ORDER — AMPHETAMINE-DEXTROAMPHETAMINE 15 MG PO TABS: 15.0000 mg | ORAL_TABLET | Freq: Every day | ORAL | 0 refills | Status: DC | PRN

## 2024-07-13 MED ORDER — PRAZOSIN HCL 1 MG PO CAPS: 1.0000 mg | ORAL_CAPSULE | Freq: Every day | ORAL | 1 refills | Status: DC

## 2024-07-13 NOTE — Telephone Encounter (Signed)
 Copied from CRM [id number]. Topic: Clinical - Prescription Issue >> Jul 13, 2024 11:58 AM Sophia H wrote: Reason for CRM: Patient states he had amphetamine -dextroamphetamine  (ADDERALL XR) 20 MG 24 hr capsule sent to pharmacy and it was dated incorrectly. States the date shows for October and not September. Per pharmacy one of the Adderall XR 20MG  needs to be cancelled out and replaced with correct date for September (today).    [id number] - [place], [place] - [address]

## 2024-07-13 NOTE — Assessment & Plan Note (Signed)
 Depression with recent exacerbation related to sertraline  dosage increase. Reduction to 100 mg improved mood and symptoms. - Continue sertraline  100 mg daily. - Discussed potential Wellbutrin use as adjunctive therapy if symptoms worsen, especially anhedonia.

## 2024-07-13 NOTE — Assessment & Plan Note (Signed)
 Has had some pharmacy issues, most recently picked up lower dose based on pharmacy availability. Should be on 20mg  XR and 15 IR. [facility] to [facility] for next few refills.

## 2024-07-13 NOTE — Telephone Encounter (Signed)
 Called and spoke with pharmacy informed me that pt picked up script called and verified with patient he stated he was unable to get the 20mg  but did get the 15mg  , called and spoke with pharmacy again and was informed that they were in the process of filling the 20mg  script patient notified

## 2024-07-13 NOTE — Progress Notes (Signed)
 Telehealth Visit  I connected with  [name] on 07/13/24 by a video enabled telemedicine application and verified that I am speaking with the correct person using two identifiers.   I discussed the limitations of evaluation and management by telemedicine. The patient expressed understanding and agreed to proceed.  Subjective:    Patient ID: Adam Clayton, male    DOB: 09-14-1996, 28 y.o.   MRN: [id number]  HPI: Adam Clayton is a 28 y.o. male  Chief Complaint  Patient presents with   Follow-up    Discussed the use of AI scribe software for clinical note transcription with the patient, who gave verbal consent to proceed.  History of Present Illness   Adam Clayton is a 28 year old male with a history of depression and ADHD who presents with medication management issues and sleep disturbances.  He has been experiencing ongoing issues with pharmacy supply, specifically with [facility], which has led to difficulties in obtaining his prescribed medications. He was unable to get the correct dosage and had to settle for a lower dose of 15 mg instead of the prescribed 20 mg and 10 mg combination. He requests to have his prescriptions sent to [facility] in [place] for easier access before his trip to [place] .  He has been taking sertraline , previously at a dose of 200 mg, but reduced it to 100 mg due to experiencing severe depression for about three weeks. He describes feeling extremely apathetic during this period, stating that he felt as if he would not react to significant emotional events. After reducing the dose to 100 mg, he feels 'perfect' and notes that he had been on this dose for a long time previously.  He describes sleep disturbances characterized by vivid dreams and physical actions during sleep, such as elbowing his girlfriend or grabbing her wrist. He also wakes up sweating every night. These symptoms have been ongoing and are affecting his girlfriend's sleep as well. He  mentions a conversation with a colleague who was diagnosed with PTSD and suggested he talk to his doctor about his symptoms.  He has a history of trying Wellbutrin in the past for ADHD, but it was not effective for that purpose. He has not taken it for depression. He is currently managing his ADHD with other treatments.      Relevant past medical, surgical, family and social history reviewed and updated as indicated. Interim medical history since our last visit reviewed. Allergies and medications reviewed and updated.  ROS per HPI unless specifically indicated above     Objective:    There were no vitals taken for this visit.  Wt Readings from Last 3 Encounters:  10/14/23 245 lb (111.1 kg)  09/13/23 245 lb (111.1 kg)  08/17/23 248 lb 3.2 oz (112.6 kg)     Physical Exam Constitutional:      General: He is not in acute distress.    Appearance: He is normal weight. He is not ill-appearing.  Pulmonary:     Effort: Pulmonary effort is normal.  Neurological:     General: No focal deficit present.     Mental Status: He is alert. Mental status is at baseline.  Psychiatric:        Mood and Affect: Mood normal.        Behavior: Behavior normal.      LIMITED EXAM GIVEN VIDEO VISIT     Assessment & Plan:  Assessment & Plan   MDD (major depressive disorder), recurrent, in partial remission GAD (  generalized anxiety disorder) Assessment & Plan: Depression with recent exacerbation related to sertraline  dosage increase. Reduction to 100 mg improved mood and symptoms. - Continue sertraline  100 mg daily. - Discussed potential Wellbutrin use as adjunctive therapy if symptoms worsen, especially anhedonia.   Attention deficit hyperactivity disorder (ADHD), combined type Assessment & Plan: Has had some pharmacy issues, most recently picked up lower dose based on pharmacy availability. Should be on 20mg  XR and 15 IR. [facility].   Orders: -      Amphetamine -Dextroamphet ER; Take 1 capsule (20 mg total) by mouth in the morning.  Dispense: 30 capsule; Refill: 0 -     Amphetamine -Dextroamphetamine ; Take 1 tablet by mouth daily as needed.  Dispense: 30 tablet; Refill: 0   Psychophysiological insomnia Assessment & Plan: Suspected PTSD with sleep disturbances, including nightmares and insomnia, causing concern for potential harm during sleep. - Prescribe prazosin  nightly for insomnia and nightmares. Send prescription to [facility] in [place]. - Advise monitoring for side effects, especially hypotension, and report issues. - Instruct to message if starting dose is insufficient, not to exceed two tablets without consultation.  Orders: -     Prazosin  HCl; Take 1 capsule (1 mg total) by mouth at bedtime.  Dispense: 90 capsule; Refill: 1   Follow up plan: Already scheduled  Hadassah SHAUNNA Nett, MD   This visit was completed via video visit through MyChart due to the restrictions of the COVID-19 pandemic. All issues as above were discussed and addressed. Physical exam was done as above through visual confirmation on video through MyChart. If it was felt that the patient should be evaluated in the office, they were directed there. The patient verbally consented to this visit.  Location of the patient: parking lot Location of the provider: work Those involved with this call:  Provider: Hadassah Nett, MD CMA: Cena Maffucci, CMA Time spent on call: 15 minutes with patient face to face via video conference. More than 50% of this time was spent in counseling and coordination of care. 15 minutes total spent in review of patient's record and preparation of their chart. Total time spent on this encounter: 30 minutes.

## 2024-07-13 NOTE — Progress Notes (Signed)
 Resent XR rx  Adam SHAUNNA Nett, MD

## 2024-07-13 NOTE — Assessment & Plan Note (Addendum)
 Suspected PTSD with sleep disturbances, including nightmares and insomnia, causing concern for potential harm during sleep. - Prescribe prazosin  nightly for insomnia and nightmares. Send prescription to [facility] in [place]. - Advise monitoring for side effects, especially hypotension, and report issues. - Instruct to message if starting dose is insufficient, not to exceed two tablets without consultation.

## 2024-07-30 DIAGNOSIS — M62830 Muscle spasm of back: Secondary | ICD-10-CM | POA: Diagnosis not present

## 2024-07-30 DIAGNOSIS — M9903 Segmental and somatic dysfunction of lumbar region: Secondary | ICD-10-CM | POA: Diagnosis not present

## 2024-07-30 DIAGNOSIS — M62451 Contracture of muscle, right thigh: Secondary | ICD-10-CM | POA: Diagnosis not present

## 2024-07-30 DIAGNOSIS — M62452 Contracture of muscle, left thigh: Secondary | ICD-10-CM | POA: Diagnosis not present

## 2024-07-30 DIAGNOSIS — R293 Abnormal posture: Secondary | ICD-10-CM | POA: Diagnosis not present

## 2024-07-30 DIAGNOSIS — M9906 Segmental and somatic dysfunction of lower extremity: Secondary | ICD-10-CM | POA: Diagnosis not present

## 2024-07-31 DIAGNOSIS — M62451 Contracture of muscle, right thigh: Secondary | ICD-10-CM | POA: Diagnosis not present

## 2024-07-31 DIAGNOSIS — M62452 Contracture of muscle, left thigh: Secondary | ICD-10-CM | POA: Diagnosis not present

## 2024-07-31 DIAGNOSIS — R293 Abnormal posture: Secondary | ICD-10-CM | POA: Diagnosis not present

## 2024-07-31 DIAGNOSIS — M9906 Segmental and somatic dysfunction of lower extremity: Secondary | ICD-10-CM | POA: Diagnosis not present

## 2024-07-31 DIAGNOSIS — M5431 Sciatica, right side: Secondary | ICD-10-CM | POA: Diagnosis not present

## 2024-07-31 DIAGNOSIS — M62830 Muscle spasm of back: Secondary | ICD-10-CM | POA: Diagnosis not present

## 2024-07-31 DIAGNOSIS — M9903 Segmental and somatic dysfunction of lumbar region: Secondary | ICD-10-CM | POA: Diagnosis not present

## 2024-10-04 ENCOUNTER — Ambulatory Visit: Admitting: Pediatrics

## 2024-10-04 VITALS — BP 115/75 | HR 74 | Temp 98.0°F | Wt 244.8 lb

## 2024-10-04 DIAGNOSIS — R399 Unspecified symptoms and signs involving the genitourinary system: Secondary | ICD-10-CM | POA: Diagnosis not present

## 2024-10-04 DIAGNOSIS — F3341 Major depressive disorder, recurrent, in partial remission: Secondary | ICD-10-CM

## 2024-10-04 DIAGNOSIS — F5104 Psychophysiologic insomnia: Secondary | ICD-10-CM

## 2024-10-04 DIAGNOSIS — Z6831 Body mass index (BMI) 31.0-31.9, adult: Secondary | ICD-10-CM

## 2024-10-04 DIAGNOSIS — F902 Attention-deficit hyperactivity disorder, combined type: Secondary | ICD-10-CM

## 2024-10-04 LAB — URINALYSIS, ROUTINE W REFLEX MICROSCOPIC
Bilirubin, UA: NEGATIVE
Glucose, UA: NEGATIVE
Ketones, UA: NEGATIVE
Leukocytes,UA: NEGATIVE
Nitrite, UA: NEGATIVE
RBC, UA: NEGATIVE
Specific Gravity, UA: 1.025 (ref 1.005–1.030)
Urobilinogen, Ur: 1 mg/dL (ref 0.2–1.0)
pH, UA: 6.5 (ref 5.0–7.5)

## 2024-10-04 LAB — MICROSCOPIC EXAMINATION
Bacteria, UA: NONE SEEN
WBC, UA: NONE SEEN /HPF (ref 0–5)

## 2024-10-04 MED ORDER — AMPHETAMINE-DEXTROAMPHET ER 20 MG PO CP24: 20.0000 mg | ORAL_CAPSULE | Freq: Every morning | ORAL | 0 refills | Status: AC

## 2024-10-04 MED ORDER — AMPHETAMINE-DEXTROAMPHETAMINE 15 MG PO TABS: 15.0000 mg | ORAL_TABLET | Freq: Every day | ORAL | 0 refills | Status: AC | PRN

## 2024-10-04 MED ORDER — SERTRALINE HCL 50 MG PO TABS: 50.0000 mg | ORAL_TABLET | Freq: Every day | ORAL | 3 refills | Status: AC

## 2024-10-04 MED ORDER — AMPHETAMINE-DEXTROAMPHETAMINE 15 MG PO TABS: 15.0000 mg | ORAL_TABLET | Freq: Every day | ORAL | 0 refills | Status: DC | PRN

## 2024-10-04 NOTE — Progress Notes (Unsigned)
 Office Visit  BP 115/75   Pulse 74   Temp 98 F (36.7 C) (Oral)   Wt 244 lb 12.8 oz (111 kg)   SpO2 98%   BMI 31.43 kg/m    Subjective:    Patient ID: Adam Clayton, male    DOB: [birth year], 28 y.o.   MRN: [id number]  HPI: Adam Clayton is a 28 y.o. male  Chief Complaint  Patient presents with   ADHD   Depression    Discussed the use of AI scribe software for clinical note transcription with the patient, who gave verbal consent to proceed.  History of Present Illness     Relevant past medical, surgical, family and social history reviewed and updated as indicated. Interim medical history since our last visit reviewed. Allergies and medications reviewed and updated.  ROS per HPI unless specifically indicated above     Objective:    BP 115/75   Pulse 74   Temp 98 F (36.7 C) (Oral)   Wt 244 lb 12.8 oz (111 kg)   SpO2 98%   BMI 31.43 kg/m   Wt Readings from Last 3 Encounters:  10/04/24 244 lb 12.8 oz (111 kg)  10/14/23 245 lb (111.1 kg)  09/13/23 245 lb (111.1 kg)     Physical Exam      10/04/2024    3:04 PM 07/13/2024    9:44 AM 05/08/2024    4:23 PM 02/10/2024    3:38 PM 11/01/2023    8:39 AM  Depression screen PHQ 2/9  Decreased Interest 1 0 0 0 1  Down, Depressed, Hopeless 1 0 0 1 0  PHQ - 2 Score 2 0 0 1 1  Altered sleeping 1 0 0  1  Tired, decreased energy 1 0 0  1  Change in appetite 1 0 0  0  Feeling bad or failure about yourself  1 0 0  0  Trouble concentrating 2 0 0  1  Moving slowly or fidgety/restless 0 0 0  1  Suicidal thoughts 0 0 0  0  PHQ-9 Score 8 0  0   5   Difficult doing work/chores Somewhat difficult Not difficult at all Not difficult at all  Not difficult at all     Data saved with a previous flowsheet row definition       10/04/2024    3:06 PM 07/13/2024    9:44 AM 05/08/2024    4:24 PM 02/10/2024    3:39 PM  GAD 7 : Generalized Anxiety Score  Nervous, Anxious, on Edge 1 0 0 1  Control/stop worrying 1 0 0 0  Worry  too much - different things 1 0 0 1  Trouble relaxing 1 0 0 1  Restless 2 0 0 2  Easily annoyed or irritable 1 0 0 1  Afraid - awful might happen 1 0 0 1  Total GAD 7 Score 8 0 0 7  Anxiety Difficulty Somewhat difficult Not difficult at all Not difficult at all Somewhat difficult       Assessment & Plan:  Assessment & Plan   MDD (major depressive disorder), recurrent, in partial remission -     Sertraline  HCl; Take 1 tablet (50 mg total) by mouth daily.  Dispense: 30 tablet; Refill: 3  Urinary tract infection symptoms -     Urinalysis, Routine w reflex microscopic -     Urine Culture  BMI 31.0-31.9,adult -     Hemoglobin A1c -     Comprehensive  metabolic panel with GFR -     Lipid panel  Attention deficit hyperactivity disorder (ADHD), combined type -     Amphetamine -Dextroamphetamine ; Take 1 tablet by mouth daily as needed.  Dispense: 30 tablet; Refill: 0 -     Amphetamine -Dextroamphet ER; Take 1 capsule (20 mg total) by mouth in the morning.  Dispense: 30 capsule; Refill: 0 -     Amphetamine -Dextroamphet ER; Take 1 capsule (20 mg total) by mouth in the morning.  Dispense: 30 capsule; Refill: 0 -     Amphetamine -Dextroamphetamine ; Take 1 tablet by mouth daily as needed.  Dispense: 30 tablet; Refill: 0 -     Amphetamine -Dextroamphet ER; Take 1 capsule (20 mg total) by mouth in the morning.  Dispense: 30 capsule; Refill: 0 -     Amphetamine -Dextroamphetamine ; Take 1 tablet by mouth daily as needed.  Dispense: 30 tablet; Refill: 0  Psychophysiological insomnia     Assessment and Plan Assessment & Plan      Follow up plan: Return for ADHD.  Hadassah SHAUNNA Nett, MD

## 2024-10-06 LAB — URINE CULTURE

## 2024-10-06 LAB — HEMOGLOBIN A1C
Est. average glucose Bld gHb Est-mCnc: 105 mg/dL
Hgb A1c MFr Bld: 5.3 % (ref 4.8–5.6)

## 2024-10-06 LAB — COMPREHENSIVE METABOLIC PANEL WITH GFR
ALT: 21 IU/L (ref 0–44)
AST: 20 IU/L (ref 0–40)
Albumin: 4.5 g/dL (ref 4.3–5.2)
Alkaline Phosphatase: 73 IU/L (ref 47–123)
BUN/Creatinine Ratio: 10 (ref 9–20)
BUN: 13 mg/dL (ref 6–20)
Bilirubin Total: 0.4 mg/dL (ref 0.0–1.2)
CO2: 25 mmol/L (ref 20–29)
Calcium: 9.4 mg/dL (ref 8.7–10.2)
Chloride: 103 mmol/L (ref 96–106)
Creatinine, Ser: 1.27 mg/dL (ref 0.76–1.27)
Globulin, Total: 2.5 g/dL (ref 1.5–4.5)
Glucose: 76 mg/dL (ref 70–99)
Potassium: 4.2 mmol/L (ref 3.5–5.2)
Sodium: 143 mmol/L (ref 134–144)
Total Protein: 7 g/dL (ref 6.0–8.5)
eGFR: 78 mL/min/1.73

## 2024-10-06 LAB — LIPID PANEL
Chol/HDL Ratio: 2.8 ratio (ref 0.0–5.0)
Cholesterol, Total: 149 mg/dL (ref 100–199)
HDL: 53 mg/dL
LDL Chol Calc (NIH): 59 mg/dL (ref 0–99)
Triglycerides: 233 mg/dL — ABNORMAL HIGH (ref 0–149)
VLDL Cholesterol Cal: 37 mg/dL (ref 5–40)

## 2024-10-07 ENCOUNTER — Encounter: Payer: Self-pay | Admitting: Pediatrics

## 2024-10-07 ENCOUNTER — Ambulatory Visit: Payer: Self-pay | Admitting: Pediatrics

## 2024-10-07 DIAGNOSIS — Z6831 Body mass index (BMI) 31.0-31.9, adult: Secondary | ICD-10-CM | POA: Insufficient documentation

## 2024-10-07 NOTE — Assessment & Plan Note (Addendum)
 Depressive symptoms in partial remission due to effective ADHD treatment. Self titrated down to 50mg  daily of zoloft . Insomnia resolved, previous concern for PTSD type insomnia but prazosin  not tolerated well, also discontinued this on his own. CTM.

## 2024-10-07 NOTE — Assessment & Plan Note (Signed)
 ADHD symptoms well-managed with current medication. Dose adjustment to 50 mg improved symptoms and reduced side effects. ADHD treatment positively impacted anxiety and depression symptoms. - Prescribed 50 mg tablets. - Continue current ADHD regimen.

## 2024-10-07 NOTE — Assessment & Plan Note (Signed)
 Risk stratification with below labs.

## 2024-10-15 ENCOUNTER — Encounter: Payer: Self-pay | Admitting: Nurse Practitioner

## 2024-10-15 ENCOUNTER — Ambulatory Visit (INDEPENDENT_AMBULATORY_CARE_PROVIDER_SITE_OTHER): Admitting: Nurse Practitioner

## 2024-10-15 VITALS — BP 131/84 | HR 94 | Temp 97.3°F | Ht 74.0 in | Wt 246.2 lb

## 2024-10-15 DIAGNOSIS — Z0000 Encounter for general adult medical examination without abnormal findings: Secondary | ICD-10-CM

## 2024-10-15 DIAGNOSIS — Z136 Encounter for screening for cardiovascular disorders: Secondary | ICD-10-CM

## 2024-10-15 NOTE — Progress Notes (Signed)
 "  BP 131/84   Pulse 94   Temp (!) 97.3 F (36.3 C) (Oral)   Ht 6' 2 (1.88 m)   Wt 246 lb 3.2 oz (111.7 kg)   SpO2 98%   BMI 31.61 kg/m    Subjective:    Patient ID: Adam Clayton, male    DOB: [birth year], 29 y.o.   MRN: [id number]  HPI: Adam Clayton is a 28 y.o. male presenting on 10/15/2024 for comprehensive medical examination. Current medical complaints include:none  He currently lives with: Interim Problems from his last visit: no  Depression Screen done today and results listed below:     10/04/2024    3:04 PM 07/13/2024    9:44 AM 05/08/2024    4:23 PM 02/10/2024    3:38 PM 11/01/2023    8:39 AM  Depression screen PHQ 2/9  Decreased Interest 1 0 0 0 1  Down, Depressed, Hopeless 1 0 0 1 0  PHQ - 2 Score 2 0 0 1 1  Altered sleeping 1 0 0  1  Tired, decreased energy 1 0 0  1  Change in appetite 1 0 0  0  Feeling bad or failure about yourself  1 0 0  0  Trouble concentrating 2 0 0  1  Moving slowly or fidgety/restless 0 0 0  1  Suicidal thoughts 0 0 0  0  PHQ-9 Score 8 0  0   5   Difficult doing work/chores Somewhat difficult Not difficult at all Not difficult at all  Not difficult at all     Data saved with a previous flowsheet row definition    The patient does not have a history of falls. I did complete a risk assessment for falls. A plan of care for falls was documented.   Past Medical History:  Past Medical History:  Diagnosis Date   Acute right-sided low back pain with right-sided sciatica 05/12/2017   Last Assessment & Plan:   Resolving back pain. Continues to have significant weakness in dorsiflexion in right great toe and ankle c/w L5 radiculopathy. Alexsander feels that his symptoms are improving daily. Released to resume work with restriction on prolonged sitting or standing for the next 2 weeks. No weight lifting and specifically no dead lifts or squats until pain and weakness are completely re   Anxiety    Arthritis    Asthma    Closed fracture of shaft  of metacarpal bone 08/17/2023   Depression    Low libido 10/16/2021   Traumatic rupture of biceps tendon 08/10/2021    Surgical History:  Past Surgical History:  Procedure Laterality Date   bicep reattachment Left 2022    Medications:  Current Outpatient Medications on File Prior to Visit  Medication Sig   amphetamine -dextroamphetamine  (ADDERALL XR) 20 MG 24 hr capsule Take 1 capsule (20 mg total) by mouth in the morning.   [START ON 11/04/2024] amphetamine -dextroamphetamine  (ADDERALL XR) 20 MG 24 hr capsule Take 1 capsule (20 mg total) by mouth in the morning.   [START ON 12/05/2024] amphetamine -dextroamphetamine  (ADDERALL XR) 20 MG 24 hr capsule Take 1 capsule (20 mg total) by mouth in the morning.   amphetamine -dextroamphetamine  (ADDERALL) 15 MG tablet Take 1 tablet by mouth daily as needed.   [START ON 11/04/2024] amphetamine -dextroamphetamine  (ADDERALL) 15 MG tablet Take 1 tablet by mouth daily as needed.   [START ON 12/05/2024] amphetamine -dextroamphetamine  (ADDERALL) 15 MG tablet Take 1 tablet by mouth daily as needed.   sertraline  (ZOLOFT ) 50 MG tablet  Take 1 tablet (50 mg total) by mouth daily.   [DISCONTINUED] gabapentin  (NEURONTIN ) 300 MG capsule 1 tab 300 mg on day 1; 1 tabs BID day 2; 1 tab 3 times a day on day 3 and thereafter   [DISCONTINUED] traZODone  (DESYREL ) 50 MG tablet Take 1 tablet (50 mg total) by mouth at bedtime.   No current facility-administered medications on file prior to visit.    Allergies:  Allergies[1]  Social History:  Social History   Socioeconomic History   Marital status: Single    Spouse name: Not on file   Number of children: Not on file   Years of education: Not on file   Highest education level: Some college, no degree  Occupational History   Not on file  Tobacco Use   Smoking status: Never   Smokeless tobacco: Never  Vaping Use   Vaping status: Every Day   Substances: Nicotine  Substance and Sexual Activity   Alcohol use: Not  Currently    Comment: Have been free of alcohol for the last 3 years   Drug use: Never   Sexual activity: Yes    Birth control/protection: None  Other Topics Concern   Not on file  Social History Narrative   Not on file   Social Drivers of Health   Tobacco Use: Low Risk (10/15/2024)   Patient History    Smoking Tobacco Use: Never    Smokeless Tobacco Use: Never    Passive Exposure: Not on file  Financial Resource Strain: Medium Risk (10/04/2024)   Overall Financial Resource Strain (CARDIA)    Difficulty of Paying Living Expenses: Somewhat hard  Food Insecurity: Food Insecurity Present (10/04/2024)   Epic    Worried About Programme Researcher, Broadcasting/film/video in the Last Year: Sometimes true    Ran Out of Food in the Last Year: Sometimes true  Transportation Needs: No Transportation Needs (10/04/2024)   Epic    Lack of Transportation (Medical): No    Lack of Transportation (Non-Medical): No  Physical Activity: Sufficiently Active (10/04/2024)   Exercise Vital Sign    Days of Exercise per Week: 5 days    Minutes of Exercise per Session: 30 min  Stress: Stress Concern Present (10/04/2024)   [employer] of Occupational Health - Occupational Stress Questionnaire    Feeling of Stress: To some extent  Social Connections: Socially Integrated (10/04/2024)   Social Connection and Isolation Panel    Frequency of Communication with Friends and Family: More than three times a week    Frequency of Social Gatherings with Friends and Family: Once a week    Attends Religious Services: More than 4 times per year    Active Member of Clubs or Organizations: Yes    Attends Banker Meetings: More than 4 times per year    Marital Status: Living with partner  Intimate Partner Violence: Not At Risk (10/15/2024)   Epic    Fear of Current or Ex-Partner: No    Emotionally Abused: No    Physically Abused: No    Sexually Abused: No  Depression (PHQ2-9): Medium Risk (10/04/2024)   Depression  (PHQ2-9)    PHQ-2 Score: 8  Alcohol Screen: Low Risk (10/15/2024)   Alcohol Screen    Last Alcohol Screening Score (AUDIT): 0  Housing: High Risk (10/04/2024)   Epic    Unable to Pay for Housing in the Last Year: Yes    Number of Times Moved in the Last Year: 0    Homeless in  the Last Year: No  Utilities: Not At Risk (10/15/2024)   Epic    Threatened with loss of utilities: No  Health Literacy: Adequate Health Literacy (10/15/2024)   B1300 Health Literacy    Frequency of need for help with medical instructions: Never   Tobacco Use History[2] Social History   Substance and Sexual Activity  Alcohol Use Not Currently   Comment: Have been free of alcohol for the last 3 years    Family History:  History reviewed. No pertinent family history.  Past medical history, surgical history, medications, allergies, family history and social history reviewed with patient today and changes made to appropriate areas of the chart.   Review of Systems  All other systems reviewed and are negative.  All other ROS negative except what is listed above and in the HPI.      Objective:    BP 131/84   Pulse 94   Temp (!) 97.3 F (36.3 C) (Oral)   Ht 6' 2 (1.88 m)   Wt 246 lb 3.2 oz (111.7 kg)   SpO2 98%   BMI 31.61 kg/m   Wt Readings from Last 3 Encounters:  10/15/24 246 lb 3.2 oz (111.7 kg)  10/04/24 244 lb 12.8 oz (111 kg)  10/14/23 245 lb (111.1 kg)    Physical Exam Vitals and nursing note reviewed.  Constitutional:      General: He is not in acute distress.    Appearance: Normal appearance. He is not ill-appearing, toxic-appearing or diaphoretic.  HENT:     Head: Normocephalic.     Right Ear: Tympanic membrane, ear canal and external ear normal.     Left Ear: Tympanic membrane, ear canal and external ear normal.     Nose: Nose normal. No congestion or rhinorrhea.     Mouth/Throat:     Mouth: Mucous membranes are moist.  Eyes:     General:        Right eye: No discharge.         Left eye: No discharge.     Extraocular Movements: Extraocular movements intact.     Conjunctiva/sclera: Conjunctivae normal.     Pupils: Pupils are equal, round, and reactive to light.  Cardiovascular:     Rate and Rhythm: Normal rate and regular rhythm.     Heart sounds: No murmur heard. Pulmonary:     Effort: Pulmonary effort is normal. No respiratory distress.     Breath sounds: Normal breath sounds. No wheezing, rhonchi or rales.  Abdominal:     General: Abdomen is flat. Bowel sounds are normal. There is no distension.     Palpations: Abdomen is soft.     Tenderness: There is no abdominal tenderness. There is no guarding.  Musculoskeletal:     Cervical back: Normal range of motion and neck supple.  Skin:    General: Skin is warm and dry.     Capillary Refill: Capillary refill takes less than 2 seconds.  Neurological:     General: No focal deficit present.     Mental Status: He is alert and oriented to person, place, and time.     Cranial Nerves: No cranial nerve deficit.     Motor: No weakness.     Deep Tendon Reflexes: Reflexes normal.  Psychiatric:        Mood and Affect: Mood normal.        Behavior: Behavior normal.        Thought Content: Thought content normal.  Judgment: Judgment normal.     Results for orders placed or performed in visit on 10/04/24  Urine Culture   Collection Time: 10/04/24  3:14 PM   Specimen: Urine   UR  Result Value Ref Range   Urine Culture, Routine Final report    Organism ID, Bacteria Comment   Microscopic Examination   Collection Time: 10/04/24  3:14 PM   Urine  Result Value Ref Range   WBC, UA None seen 0 - 5 /hpf   RBC, Urine 0-2 0 - 2 /hpf   Epithelial Cells (non renal) 0-10 0 - 10 /hpf   Bacteria, UA None seen None seen/Few  Urinalysis, Routine w reflex microscopic   Collection Time: 10/04/24  3:14 PM  Result Value Ref Range   Specific Gravity, UA 1.025 1.005 - 1.030   pH, UA 6.5 5.0 - 7.5   Color, UA Yellow  Yellow   Appearance Ur Clear Clear   Leukocytes,UA Negative Negative   Protein,UA 1+ (A) Negative/Trace   Glucose, UA Negative Negative   Ketones, UA Negative Negative   RBC, UA Negative Negative   Bilirubin, UA Negative Negative   Urobilinogen, Ur 1.0 0.2 - 1.0 mg/dL   Nitrite, UA Negative Negative   Microscopic Examination See below:   HgB A1c   Collection Time: 10/04/24  3:15 PM  Result Value Ref Range   Hgb A1c MFr Bld 5.3 4.8 - 5.6 %   Est. average glucose Bld gHb Est-mCnc 105 mg/dL  Comp Met (CMET)   Collection Time: 10/04/24  3:15 PM  Result Value Ref Range   Glucose 76 70 - 99 mg/dL   BUN 13 6 - 20 mg/dL   Creatinine, Ser 8.72 0.76 - 1.27 mg/dL   eGFR 78 >40 fO/fpw/8.26   BUN/Creatinine Ratio 10 9 - 20   Sodium 143 134 - 144 mmol/L   Potassium 4.2 3.5 - 5.2 mmol/L   Chloride 103 96 - 106 mmol/L   CO2 25 20 - 29 mmol/L   Calcium 9.4 8.7 - 10.2 mg/dL   Total Protein 7.0 6.0 - 8.5 g/dL   Albumin 4.5 4.3 - 5.2 g/dL   Globulin, Total 2.5 1.5 - 4.5 g/dL   Bilirubin Total 0.4 0.0 - 1.2 mg/dL   Alkaline Phosphatase 73 47 - 123 IU/L   AST 20 0 - 40 IU/L   ALT 21 0 - 44 IU/L  Lipid Profile   Collection Time: 10/04/24  3:15 PM  Result Value Ref Range   Cholesterol, Total 149 100 - 199 mg/dL   Triglycerides 766 (H) 0 - 149 mg/dL   HDL 53 >60 mg/dL   VLDL Cholesterol Cal 37 5 - 40 mg/dL   LDL Chol Calc (NIH) 59 0 - 99 mg/dL   Chol/HDL Ratio 2.8 0.0 - 5.0 ratio      Assessment & Plan:   Problem List Items Addressed This Visit   None Visit Diagnoses       Annual physical exam    -  Primary   Health maintenance reviewed during visit today.  Labs ordered.  Vaccines reviewed.     Screening for ischemic heart disease            Discussed aspirin prophylaxis for myocardial infarction prevention and decision was it was not indicated  LABORATORY TESTING:  Health maintenance labs ordered today as discussed above.     IMMUNIZATIONS:   - Tdap: Tetanus vaccination  status reviewed: last tetanus booster within 10 years. - Influenza: Refused -  Pneumovax: Up to date - Prevnar: Up to date - COVID: Refused - HPV: Up to date - Shingrix vaccine: Not applicable  SCREENING: - Colonoscopy: Not applicable  Discussed with patient purpose of the colonoscopy is to detect colon cancer at curable precancerous or early stages   - AAA Screening: Not applicable  -Hearing Test: Not applicable  -Spirometry: Not applicable   PATIENT COUNSELING:    Sexuality: Discussed sexually transmitted diseases, partner selection, use of condoms, avoidance of unintended pregnancy  and contraceptive alternatives.   Advised to avoid cigarette smoking.  I discussed with the patient that most people either abstain from alcohol or drink within safe limits (<=14/week and <=4 drinks/occasion for males, <=7/weeks and <= 3 drinks/occasion for females) and that the risk for alcohol disorders and other health effects rises proportionally with the number of drinks per week and how often a drinker exceeds daily limits.  Discussed cessation/primary prevention of drug use and availability of treatment for abuse.   Diet: Encouraged to adjust caloric intake to maintain  or achieve ideal body weight, to reduce intake of dietary saturated fat and total fat, to limit sodium intake by avoiding high sodium foods and not adding table salt, and to maintain adequate dietary potassium and calcium preferably from fresh fruits, vegetables, and low-fat dairy products.    stressed the importance of regular exercise  Injury prevention: Discussed safety belts, safety helmets, smoke detector, smoking near bedding or upholstery.   Dental health: Discussed importance of regular tooth brushing, flossing, and dental visits.   Follow up plan: NEXT PREVENTATIVE PHYSICAL DUE IN 1 YEAR. Return in about 3 months (around 01/13/2025) for ADHD FU (virtual).     [1]  Allergies Allergen Reactions   Vancomycin Itching     i  [2]  Social History Tobacco Use  Smoking Status Never  Smokeless Tobacco Never   "

## 2024-12-25 ENCOUNTER — Ambulatory Visit: Admitting: Nurse Practitioner
